# Patient Record
Sex: Female | Born: 1990 | Hispanic: Yes | Marital: Married | Smoking: Never smoker
Health system: Southern US, Community
[De-identification: ages and names within clinical notes are randomized; demographics above are authoritative.]

## PROBLEM LIST (undated history)

## (undated) DIAGNOSIS — F32A Depression, unspecified: Secondary | ICD-10-CM

## (undated) DIAGNOSIS — J45909 Unspecified asthma, uncomplicated: Secondary | ICD-10-CM

## (undated) DIAGNOSIS — F909 Attention-deficit hyperactivity disorder, unspecified type: Secondary | ICD-10-CM

## (undated) DIAGNOSIS — F329 Major depressive disorder, single episode, unspecified: Secondary | ICD-10-CM

## (undated) HISTORY — DX: Depression, unspecified: F32.A

## (undated) HISTORY — DX: Attention-deficit hyperactivity disorder, unspecified type: F90.9

## (undated) HISTORY — PX: KNEE SURGERY: SHX244

## (undated) HISTORY — DX: Unspecified asthma, uncomplicated: J45.909

## (undated) HISTORY — PX: ARTHROSCOPIC REPAIR ACL: SUR80

## (undated) HISTORY — DX: Major depressive disorder, single episode, unspecified: F32.9

---
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
## 2010-08-26 IMAGING — CR DG CERVICAL SPINE COMPLETE 4+V
5 series · 5 of 5 positions shown · non-contrast
Comparison: None

CLINICAL DATA: Fall.  Neck trauma and pain.

CERVICAL SPINE - 4+ VIEWS

[w c-spine lat]
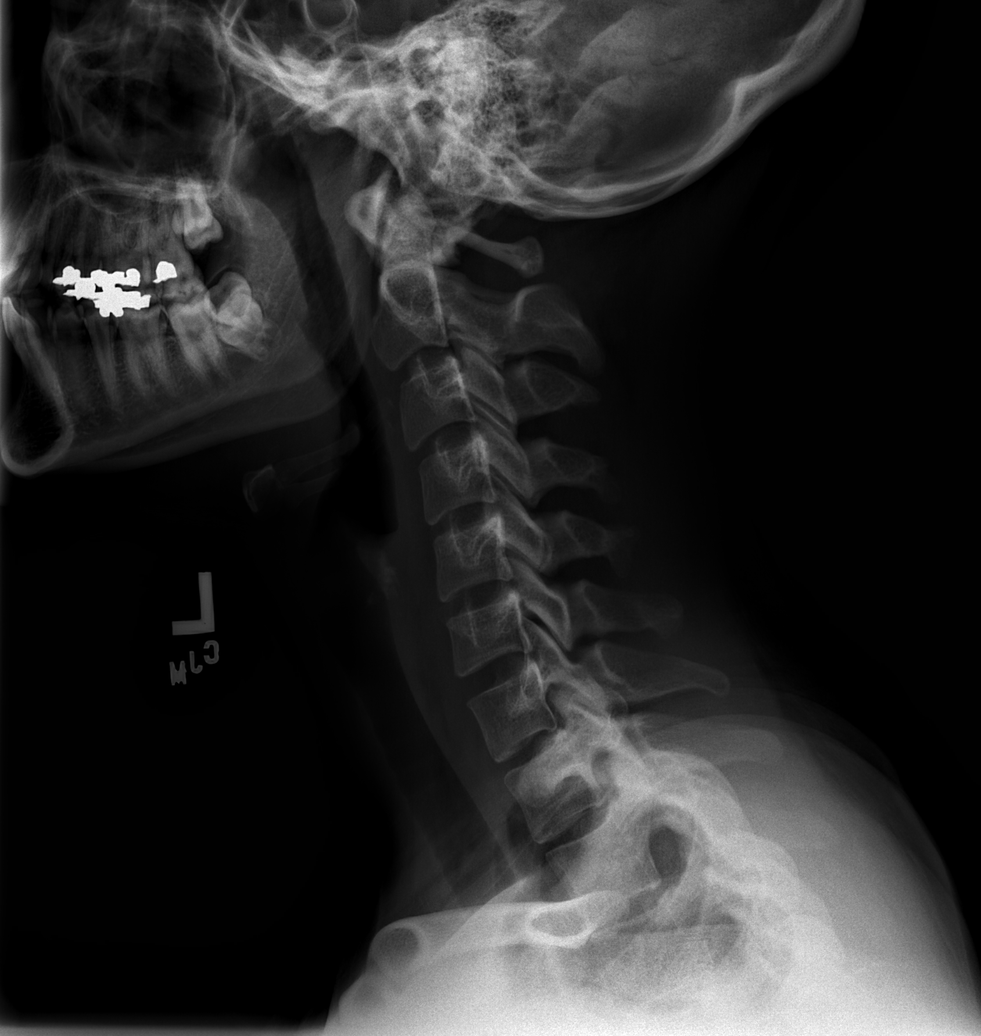

[w c-spine oblique (1 of 2)]
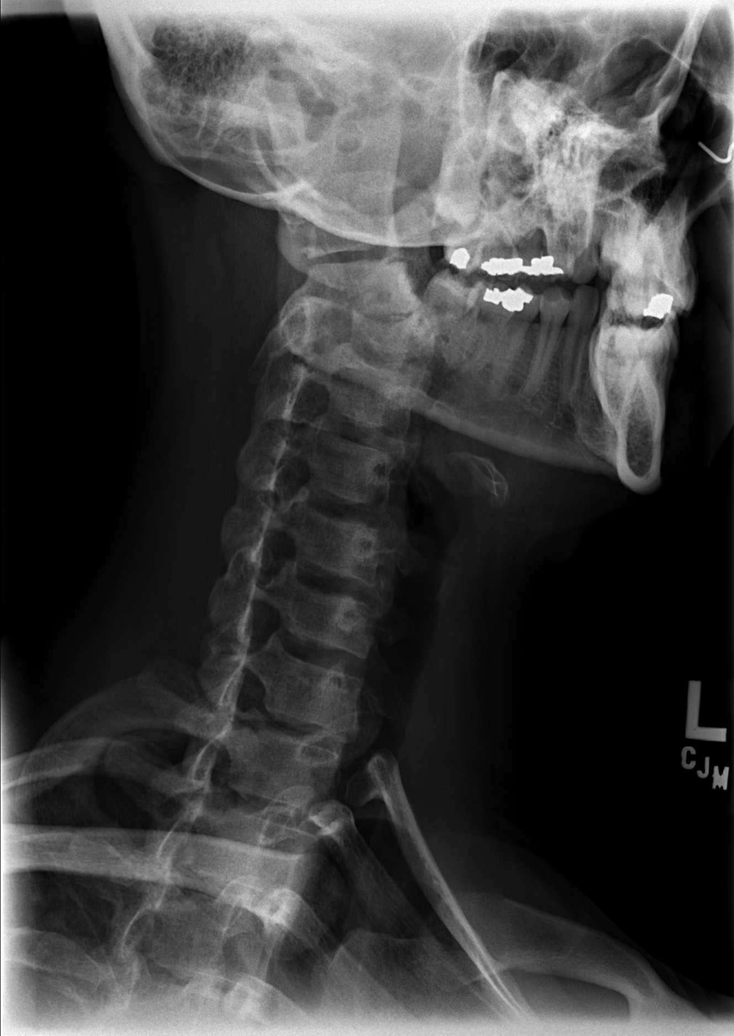

[w c-spine oblique (2 of 2)]
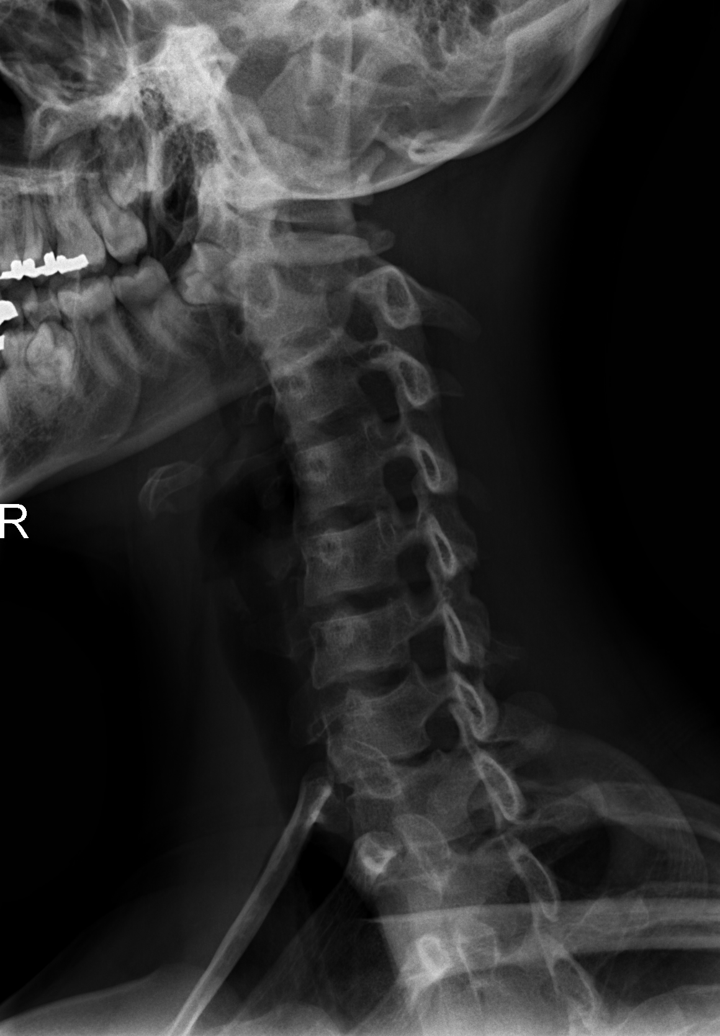

[w c-spine a.p.]
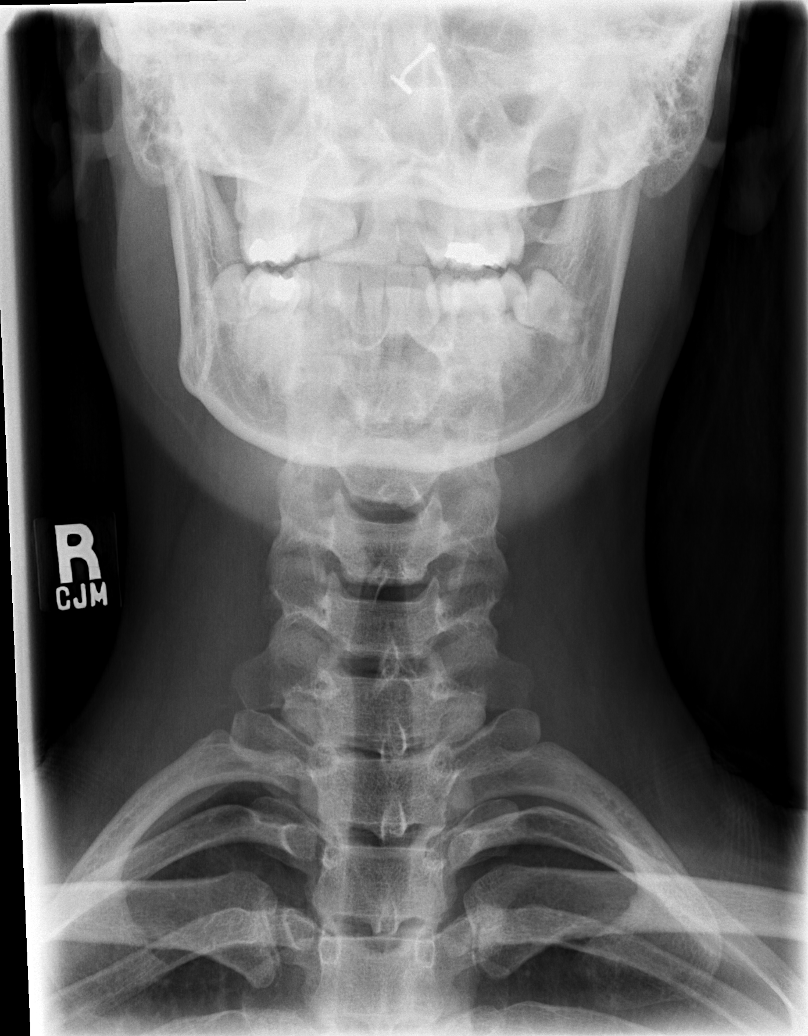

[w c-spine odontoid]
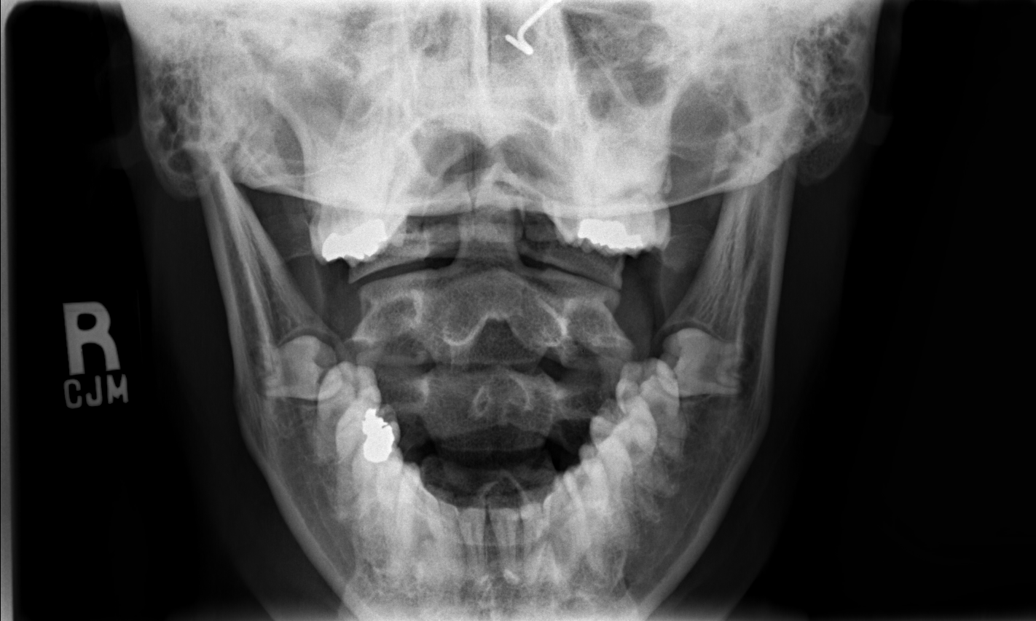

[5 of 5 positions shown; findings below may reference images not displayed]

FINDINGS: There is no evidence of cervical spine fracture or
prevertebral soft tissue swelling.  Alignment is normal.  No other
significant bone abnormalities are identified.
IMPRESSION: Negative cervical spine radiographs.

## 2011-11-21 ENCOUNTER — Ambulatory Visit (INDEPENDENT_AMBULATORY_CARE_PROVIDER_SITE_OTHER): Payer: BC Managed Care – PPO

## 2011-11-21 DIAGNOSIS — A088 Other specified intestinal infections: Secondary | ICD-10-CM

## 2012-01-14 ENCOUNTER — Telehealth: Payer: Self-pay

## 2012-01-14 DIAGNOSIS — Z309 Encounter for contraceptive management, unspecified: Secondary | ICD-10-CM

## 2012-01-14 DIAGNOSIS — F909 Attention-deficit hyperactivity disorder, unspecified type: Secondary | ICD-10-CM

## 2012-01-14 NOTE — Telephone Encounter (Signed)
Chart in box in dr lounge if needed

## 2012-01-14 NOTE — Telephone Encounter (Addendum)
.  [facility] PT IS LEAVING THE COUNTRY FOR SIX MONTHS  WANTS TO TALK WITH DR. Merla Riches REGARDING MEDS  DURING HER TIME ABROAD  CALL [phone]   [facility] called and patient doesn't understand why Dr. Merla Riches did a supply for adderall when she will be out of the country for please contact patient or pharmacy kelly the pharmacist can be reached on [phone]

## 2012-01-16 ENCOUNTER — Other Ambulatory Visit: Payer: Self-pay | Admitting: Internal Medicine

## 2012-01-16 DIAGNOSIS — F909 Attention-deficit hyperactivity disorder, unspecified type: Secondary | ICD-10-CM | POA: Insufficient documentation

## 2012-01-16 DIAGNOSIS — Z309 Encounter for contraceptive management, unspecified: Secondary | ICD-10-CM | POA: Insufficient documentation

## 2012-01-16 MED ORDER — AMPHETAMINE-DEXTROAMPHETAMINE 30 MG PO TABS: 30.0000 mg | ORAL_TABLET | Freq: Two times a day (BID) | ORAL | Status: DC

## 2012-01-18 NOTE — Telephone Encounter (Signed)
LMOM for pt to notify that her Rx is ready for p/up.

## 2012-01-18 NOTE — Telephone Encounter (Signed)
Dr Merla Riches, it looks like you have an unsigned Rx in system and it will not let me close the encounter.

## 2012-01-22 NOTE — Telephone Encounter (Signed)
PT WANTS TO SPEAK TO DR. Merla Riches RE: HER ADDERALL MEDICATION.

## 2012-01-24 NOTE — Telephone Encounter (Signed)
Get a message fron her... 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

## 2012-01-25 ENCOUNTER — Other Ambulatory Visit: Payer: Self-pay | Admitting: Internal Medicine

## 2012-01-25 DIAGNOSIS — F988 Other specified behavioral and emotional disorders with onset usually occurring in childhood and adolescence: Secondary | ICD-10-CM

## 2012-01-25 MED ORDER — AMPHETAMINE-DEXTROAMPHETAMINE 30 MG PO TABS: 30.0000 mg | ORAL_TABLET | Freq: Two times a day (BID) | ORAL | Status: DC

## 2012-01-25 NOTE — Telephone Encounter (Signed)
She will come in as an add on this wed at 4pm at 104 2/13 For a pap and exam before her trip to [place]

## 2012-01-25 NOTE — Telephone Encounter (Signed)
LMOM for patient to call back so we can get questions for Dr. Merla Riches.

## 2012-01-27 ENCOUNTER — Ambulatory Visit (INDEPENDENT_AMBULATORY_CARE_PROVIDER_SITE_OTHER): Payer: BC Managed Care – PPO | Admitting: Internal Medicine

## 2012-01-27 DIAGNOSIS — F329 Major depressive disorder, single episode, unspecified: Secondary | ICD-10-CM | POA: Insufficient documentation

## 2012-01-27 DIAGNOSIS — B9689 Other specified bacterial agents as the cause of diseases classified elsewhere: Secondary | ICD-10-CM

## 2012-01-27 DIAGNOSIS — F909 Attention-deficit hyperactivity disorder, unspecified type: Secondary | ICD-10-CM | POA: Insufficient documentation

## 2012-01-27 DIAGNOSIS — N760 Acute vaginitis: Secondary | ICD-10-CM

## 2012-01-27 DIAGNOSIS — F32A Depression, unspecified: Secondary | ICD-10-CM | POA: Insufficient documentation

## 2012-01-27 LAB — POCT WET PREP WITH KOH
Clue Cells Wet Prep HPF POC: 100
RBC Wet Prep HPF POC: NEGATIVE

## 2012-01-27 MED ORDER — FLUCONAZOLE 150 MG PO TABS: 150.0000 mg | ORAL_TABLET | Freq: Once | ORAL | Status: AC

## 2012-01-27 MED ORDER — METRONIDAZOLE 500 MG PO TABS: 500.0000 mg | ORAL_TABLET | Freq: Two times a day (BID) | ORAL | Status: AC

## 2012-01-27 NOTE — Telephone Encounter (Signed)
LMOM that Rx is ready for p/up. Dr Merla Riches, it looks like the Rx needs to be signed in Epic and then you can close encounter.

## 2012-01-27 NOTE — Progress Notes (Signed)
  Subjective:    Patient ID: Jasmine Coffey, female    DOB: 11/08/1991, 20 y.o.   MRN: [id number]  HPIcomes in with a two-month history of a vaginal wetness and other with slight itching. The last department was more than 3 months ago. There is no dysuria or pain. Menses are regular  She is preparing to leave for [place] for 6 months next week. ADD meds have been updated for the duration of the trip.    Review of Systems     Objective:   Physical Exam  The introitus is clear with a thin watery white discharge at the cervix. 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

## 2012-02-03 NOTE — Telephone Encounter (Signed)
Dr Merla Riches, it looks as if you reviewed this after I routed this to you last time, but it looks as if the medication still needs to be signed and encounter closed?

## 2012-02-03 NOTE — Telephone Encounter (Signed)
#   dispensed was changed at last OV so #125 cancelled

## 2012-06-20 ENCOUNTER — Other Ambulatory Visit: Payer: Self-pay | Admitting: Internal Medicine

## 2012-08-09 ENCOUNTER — Ambulatory Visit (INDEPENDENT_AMBULATORY_CARE_PROVIDER_SITE_OTHER): Payer: BC Managed Care – PPO | Admitting: Family Medicine

## 2012-08-09 VITALS — BP 108/60 | HR 78 | Temp 98.4°F | Resp 16 | Ht 68.0 in | Wt 170.0 lb

## 2012-08-09 DIAGNOSIS — R4589 Other symptoms and signs involving emotional state: Secondary | ICD-10-CM

## 2012-08-09 DIAGNOSIS — N6019 Diffuse cystic mastopathy of unspecified breast: Secondary | ICD-10-CM

## 2012-08-09 DIAGNOSIS — F99 Mental disorder, not otherwise specified: Secondary | ICD-10-CM

## 2012-08-09 DIAGNOSIS — B9689 Other specified bacterial agents as the cause of diseases classified elsewhere: Secondary | ICD-10-CM

## 2012-08-09 DIAGNOSIS — N898 Other specified noninflammatory disorders of vagina: Secondary | ICD-10-CM

## 2012-08-09 DIAGNOSIS — Z113 Encounter for screening for infections with a predominantly sexual mode of transmission: Secondary | ICD-10-CM

## 2012-08-09 DIAGNOSIS — D229 Melanocytic nevi, unspecified: Secondary | ICD-10-CM

## 2012-08-09 DIAGNOSIS — F909 Attention-deficit hyperactivity disorder, unspecified type: Secondary | ICD-10-CM

## 2012-08-09 DIAGNOSIS — D239 Other benign neoplasm of skin, unspecified: Secondary | ICD-10-CM

## 2012-08-09 LAB — POCT URINALYSIS DIPSTICK
Bilirubin, UA: NEGATIVE
Blood, UA: NEGATIVE
Glucose, UA: NEGATIVE
Ketones, UA: NEGATIVE
Leukocytes, UA: NEGATIVE
Nitrite, UA: NEGATIVE
Protein, UA: NEGATIVE
Spec Grav, UA: >=1.03
Urobilinogen, UA: 0.2
pH, UA: 5

## 2012-08-09 LAB — POCT UA - MICROSCOPIC ONLY
Casts, Ur, LPF, POC: NEGATIVE
Crystals, Ur, HPF, POC: NEGATIVE
RBC, urine, microscopic: NEGATIVE
Yeast, UA: NEGATIVE

## 2012-08-09 LAB — POCT WET PREP WITH KOH
KOH Prep POC: NEGATIVE
RBC Wet Prep HPF POC: NEGATIVE
Trichomonas, UA: NEGATIVE
Yeast Wet Prep HPF POC: NEGATIVE

## 2012-08-09 MED ORDER — AMPHETAMINE-DEXTROAMPHETAMINE 30 MG PO TABS: 30.0000 mg | ORAL_TABLET | Freq: Two times a day (BID) | ORAL | Status: DC

## 2012-08-09 MED ORDER — METRONIDAZOLE 500 MG PO TABS: 500.0000 mg | ORAL_TABLET | Freq: Two times a day (BID) | ORAL | Status: AC

## 2012-08-09 NOTE — Progress Notes (Signed)
[facility]:  Office Visit  Chief Complaint:  Chief Complaint  Patient presents with  . Vaginal Itching    vaginal odor and itch   . Rash    skin issue/ mole? needs to be checked  . ADHD    refills for on file    HPI: Jasmine Coffey is a 21 y.o. female who complains of  1. ADHD medication refills. Sees Dr Merla Riches.  2. Had BV , has odor, denies fevers, chills, pelvic pain 3. Mole on back, does not notice when she has had it. She noticed it 2 weeks ago. Maternal grandfather wih h/o melanoma and also precancerous lesions 4. Moodiness. Tired and more irritable.   Past Medical History  Diagnosis Date  . Depression   . ADHD (attention deficit hyperactivity disorder)    No past surgical history on file. History   Social History  . Marital Status: Married    Spouse Name: N/A    Number of Children: N/A  . Years of Education: N/A   Social History Main Topics  . Smoking status: Never Smoker   . Smokeless tobacco: None  . Alcohol Use: None  . Drug Use: None  . Sexually Active: None   Other Topics Concern  . None   Social History Narrative  . None   Family History  Problem Relation Age of Onset  . Diabetes Father   . Cancer Maternal Grandfather    Allergies  Allergen Reactions  . Codeine Nausea And Vomiting   Prior to Admission medications   Medication Sig Start Date End Date Taking? Authorizing Provider  albuterol (PROVENTIL) (2.5 MG/3ML) 0.083% nebulizer solution Take 2.5 mg by nebulization every 6 (six) hours as needed.   Yes Historical Provider, MD  amphetamine-dextroamphetamine (ADDERALL, 30MG ,) 30 MG tablet Take 1 tablet (30 mg total) by mouth 2 (two) times daily. 01/25/12 07/04/12  Tonye Pearson, MD     ROS: The patient denies fevers, chills, night sweats, unintentional weight loss, chest pain, palpitations, wheezing, dyspnea on exertion, nausea, vomiting, abdominal pain, dysuria, hematuria, melena, numbness, weakness, or  tingling.  All other systems have been reviewed and were otherwise negative with the exception of those mentioned in the HPI and as above.    PHYSICAL EXAM: Filed Vitals:   08/09/12 1533  BP: 108/60  Pulse: 78  Temp: 98.4 F (36.9 C)  Resp: 16   Filed Vitals:   08/09/12 1533  Height: 5\' 8"  (1.727 m)  Weight: 170 lb (77.111 kg)   Body mass index is 25.85 kg/(m^2).  General: Alert, no acute distress HEENT:  Normocephalic, atraumatic, oropharynx patent.  Cardiovascular:  Regular rate and rhythm, no rubs murmurs or gallops.  No Carotid bruits, radial pulse intact. No pedal edema.  Respiratory: Clear to auscultation bilaterally.  No wheezes, rales, or rhonchi.  No cyanosis, no use of accessory musculature GI: No organomegaly, abdomen is soft and non-tender, positive bowel sounds.  No masses. Skin: No rashes. Blue nevus 1 mm , benign appearing Neurologic: Facial musculature symmetric. Psychiatric: Patient is appropriate throughout our interaction. Lymphatic: No cervical lymphadenopathy Musculoskeletal: Gait intact.   LABS: Results for orders placed in visit on 08/09/12  POCT WET PREP WITH KOH      Component Value Range   Trichomonas, UA Negative     Clue Cells Wet Prep HPF POC 3-12     Epithelial Wet Prep HPF POC 3-13     Yeast Wet Prep HPF POC neg     Bacteria  Wet Prep HPF POC 3+ cocci     RBC Wet Prep HPF POC neg     WBC Wet Prep HPF POC 8-15     KOH Prep POC Negative    POCT URINALYSIS DIPSTICK      Component Value Range   Color, UA yellow     Clarity, UA hazy     Glucose, UA neg     Bilirubin, UA neg     Ketones, UA neg     Spec Grav, UA >=1.030     Blood, UA neg     pH, UA 5.0     Protein, UA neg     Urobilinogen, UA 0.2     Nitrite, UA neg     Leukocytes, UA Negative    POCT UA - MICROSCOPIC ONLY      Component Value Range   WBC, Ur, HPF, POC 0-1     RBC, urine, microscopic neg     Bacteria, U Microscopic 3+     Mucus, UA 3+     Epithelial cells, urine  per micros 3-7     Crystals, Ur, HPF, POC neg     Casts, Ur, LPF, POC neg     Yeast, UA neg       EKG/XRAY:   Primary read interpreted by Dr. Conley Rolls at [facility].   ASSESSMENT/PLAN: Encounter Diagnoses  Name Primary?  . ADHD (attention deficit hyperactivity disorder) Yes  . Vaginal Discharge   . Mole of skin   . Moodiness   . Screening for STD (sexually transmitted disease)    1. Refilled meds for ADHD x 3 months 8/27, 9/27, 10/27 2. BV rx Flagyl 500 mg BID 3. Patient will decide if she wants Celexa later on or not. Advise on therapy ie CBT and meds or no meds and just therapy or just meds. She will decide what she wants to do. 3. Labs pending for STD screen Recommend f/u for annual with pap.     Hamilton Capri PHUONG, DO 08/09/2012 4:36 PM

## 2012-08-10 LAB — GC/CHLAMYDIA PROBE AMP, GENITAL
Chlamydia, DNA Probe: NEGATIVE
GC Probe Amp, Genital: NEGATIVE

## 2012-08-10 LAB — HSV(HERPES SIMPLEX VRS) I + II AB-IGG
HSV 1 Glycoprotein G Ab, IgG: 0.11 IV
HSV 2 Glycoprotein G Ab, IgG: 0.22 IV

## 2012-08-10 LAB — HIV ANTIBODY (ROUTINE TESTING W REFLEX): HIV: NONREACTIVE

## 2012-08-10 LAB — RPR

## 2012-08-16 ENCOUNTER — Encounter: Payer: Self-pay | Admitting: Family Medicine

## 2012-09-14 ENCOUNTER — Telehealth: Payer: Self-pay

## 2012-09-14 NOTE — Telephone Encounter (Signed)
It appears she saw Dr. Conley Rolls in August and had BV.  Please get details.

## 2012-09-14 NOTE — Telephone Encounter (Signed)
PT REQUESTING CALL BACK FROM DR Merla Riches RE HER BACTERIAL INFECTION.   BEST PHONE [phone]   PHARMACY [facility]

## 2012-09-15 NOTE — Telephone Encounter (Signed)
I called patient back, advised that we need to recheck so we can see if she has recurrent BV or if she has a yeast infection.

## 2012-09-15 NOTE — Telephone Encounter (Signed)
She is going to come back in for recheck. She did want me to forward message to Dr Merla Riches so he would be aware. Amy

## 2012-09-16 NOTE — Telephone Encounter (Signed)
Thanks, I called her back to advise. I left message for her.

## 2012-09-16 NOTE — Telephone Encounter (Signed)
I work 10-6 Saturday and would be glad to see her.

## 2012-10-19 NOTE — Progress Notes (Signed)
Completed prior auth over the phone for pt's Adderall 30 mg and received approval through 10/19/13. Faxed approval notice to pharmacy

## 2013-01-19 ENCOUNTER — Telehealth: Payer: Self-pay

## 2013-01-19 NOTE — Telephone Encounter (Signed)
Please call patient and let her know she will have to come in. There is no way we can just write for the medication.

## 2013-01-19 NOTE — Telephone Encounter (Signed)
Pt would like for dr. Cleta Alberts to "personally call her" for a refill on adderall. She does not have $40 to come in and be seen.   [phone]

## 2013-01-19 NOTE — Telephone Encounter (Signed)
Patient is due for follow up in regards to ADHD / she states she can not follow up and does not know when she will be able to come in. She wants renewal on Adderall, please advise.

## 2013-01-20 NOTE — Telephone Encounter (Signed)
I called her and reviewed the cardiovascular risks of the adderall, she will come in.

## 2013-02-01 ENCOUNTER — Ambulatory Visit: Payer: BC Managed Care – PPO | Admitting: Internal Medicine

## 2013-03-04 ENCOUNTER — Ambulatory Visit: Payer: Self-pay | Admitting: Family Medicine

## 2013-03-04 VITALS — BP 118/71 | HR 82 | Temp 97.9°F | Resp 16 | Ht 68.25 in | Wt 169.2 lb

## 2013-03-04 DIAGNOSIS — F909 Attention-deficit hyperactivity disorder, unspecified type: Secondary | ICD-10-CM

## 2013-03-04 MED ORDER — AMPHETAMINE-DEXTROAMPHETAMINE 30 MG PO TABS: ORAL_TABLET | ORAL | Status: DC

## 2013-03-04 NOTE — Patient Instructions (Addendum)
[facility] Policy for Prescribing Controlled Substances (Revised 10/2012) 1. Prescriptions for controlled substances will be filled by ONE provider at [facility] with whom you have established and developed a plan for your care, including follow-up. 2. You are encouraged to schedule an appointment with your prescriber at our appointment center for follow-up visits whenever possible. 3. If you request a prescription for the controlled substance while at [facility] for an acute problem (with someone other than your regular prescriber), you MAY be given a ONE-TIME prescription for a 30-day supply of the controlled substance, to allow time for you to return to see your regular prescriber for additional prescriptions.   Take medication as directed. Be cautious with the 90 day supply this summer. We cannot refill lost prescriptions.

## 2013-03-04 NOTE — Progress Notes (Signed)
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 She will be gone from May 12 for approximately 3 months. She is on ADHD medication, but do to and insurance glitch has been out for several weeks. She is here for refill of her medications. She would like to be able to be treated in a fashion that she does not need to come back in here in the next 6 weeks before going to [place].  She is generally been treated by Dr. Merla Riches. She's been on ADHD medication for about 6 years.  Objective: Healthy-appearing young lady. Physical examination not done today.  Assessment: ADHD  Plan: Refill her medications with 30 days until late April, 30 days and which will carry her until late May. Gave her a 90 day prescription that she can get filled before her trip which will cover her until she comes back in August. She was cautioned that lost prescriptions will not be refilled. She was advised of the current controlled substance policy and directed to see Dr. Merla Riches in August.

## 2013-04-24 ENCOUNTER — Ambulatory Visit: Payer: Self-pay | Admitting: Internal Medicine

## 2013-04-24 VITALS — BP 124/87 | HR 75 | Temp 98.4°F | Resp 16 | Ht 68.0 in | Wt 166.0 lb

## 2013-04-24 DIAGNOSIS — Z01419 Encounter for gynecological examination (general) (routine) without abnormal findings: Secondary | ICD-10-CM

## 2013-04-24 DIAGNOSIS — N898 Other specified noninflammatory disorders of vagina: Secondary | ICD-10-CM

## 2013-04-24 DIAGNOSIS — L293 Anogenital pruritus, unspecified: Secondary | ICD-10-CM

## 2013-04-24 LAB — POCT WET PREP WITH KOH
Clue Cells Wet Prep HPF POC: NEGATIVE
Trichomonas, UA: NEGATIVE

## 2013-04-24 MED ORDER — METRONIDAZOLE 500 MG PO TABS: 500.0000 mg | ORAL_TABLET | Freq: Two times a day (BID) | ORAL | Status: DC

## 2013-04-24 MED ORDER — FLUCONAZOLE 150 MG PO TABS: 150.0000 mg | ORAL_TABLET | Freq: Once | ORAL | Status: DC

## 2013-04-24 NOTE — Progress Notes (Signed)
  Subjective:    Patient ID: Jasmine Coffey, female    DOB: 08-19-1991, 22 y.o.   MRN: [id number]  HPI Continues to have problems with recurrent bacterial vaginosis causing redness with slight irritation and odor-no history in chart/last treatment elsewhere and was not given Diflucan in the aftermath so developed pruritus. Just had a period and her symptoms have recurred. Never pap. Sexually active/4 partners in the last 6 months/uses condoms/no dyspareunia  Only one semester of college remaining/C. 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   Review of Systems No weight loss No fever chills or night sweats Remains very active No psychological issues    Objective:   Physical Exam BP 124/87  Pulse 75  Temp(Src) 98.4 F (36.9 C) (Oral)  Resp 16  Ht 5\' 8"  (1.727 m)  Wt 166 lb (75.297 kg)  BMI 25.25 kg/m2  SpO2 100%  LMP 04/20/2013 HEENT clear Heart regular without murmur Lungs clear Abdomen benign Introitus clear Os not friable but there is slight vaginal bleeding Uterus mid position nontender No adnexal masses or tenderness  Results for orders placed in visit on 04/24/13  POCT WET PREP WITH KOH      Result Value Range   Trichomonas, UA Negative     Clue Cells Wet Prep HPF POC neg     Epithelial Wet Prep HPF POC 0-2     Yeast Wet Prep HPF POC neg     Bacteria Wet Prep HPF POC neg     RBC Wet Prep HPF POC 0-3     WBC Wet Prep HPF POC 0-1     KOH Prep POC Negative          Assessment & Plan:  Recurrent BV Yeast vaginitis due to antibiotics ?spermacide hypersens STD screen ADD AR  Will treat with Flagyl 500 twice a day for 14 days and given prescription for self treatment while in [place] if needed Start align or refresh Trial of noncoated condoms She has ADD meds for her trip Cover for yeast  Meds ordered this encounter  Medications  . metroNIDAZOLE  (FLAGYL) 500 MG tablet    Sig: Take 1 tablet (500 mg total) by mouth 2 (two) times daily.    Dispense:  14 tablet    Refill:  0  . metroNIDAZOLE (FLAGYL) 500 MG tablet    Sig: Take 1 tablet (500 mg total) by mouth 2 (two) times daily. As directed    Dispense:  42 tablet    Refill:  0  . fluconazole (DIFLUCAN) 150 MG tablet    Sig: Take 1 tablet (150 mg total) by mouth once. Once a week for 4 weeks    Dispense:  4 tablet    Refill:  0

## 2013-04-26 LAB — PAP IG W/ RFLX HPV ASCU

## 2013-04-26 LAB — GC/CHLAMYDIA PROBE AMP: GC Probe RNA: NEGATIVE

## 2013-08-09 ENCOUNTER — Ambulatory Visit: Payer: BC Managed Care – PPO | Admitting: Family Medicine

## 2013-08-09 VITALS — BP 100/70 | HR 80 | Temp 98.0°F | Resp 16 | Ht 69.0 in | Wt 168.0 lb

## 2013-08-09 DIAGNOSIS — F909 Attention-deficit hyperactivity disorder, unspecified type: Secondary | ICD-10-CM

## 2013-08-09 MED ORDER — AMPHETAMINE-DEXTROAMPHETAMINE 30 MG PO TABS: 30.0000 mg | ORAL_TABLET | Freq: Two times a day (BID) | ORAL | Status: DC

## 2013-08-09 NOTE — Progress Notes (Signed)
[facility]:  Office Visit  Chief Complaint:  Chief Complaint  Patient presents with  . Medication Refill    adderall    HPI: Jasmine Coffey is a 22 y.o. female who complains of  Here for aderrall  Refill. She is at [employer]. 1 year left.  She recently went to [place].  She is doing well. She has drug holidays on the weekend but really depends if she has things she needs to get done. She has no SEs. No SI/HI  Past Medical History  Diagnosis Date  . Depression   . ADHD (attention deficit hyperactivity disorder)   . Asthma    Past Surgical History  Procedure Laterality Date  . Knee surgery    . Arthroscopic repair acl     History   Social History  . Marital Status: Married    Spouse Name: N/A    Number of Children: N/A  . Years of Education: N/A   Social History Main Topics  . Smoking status: Never Smoker   . Smokeless tobacco: None  . Alcohol Use: 17.6 oz/week    8 Cans of beer, 8 Shots of liquor, 16 Drinks containing 0.5 oz of alcohol per week  . Drug Use: No  . Sexual Activity: Yes    Birth Control/ Protection: None   Other Topics Concern  . None   Social History Narrative  . None   Family History  Problem Relation Age of Onset  . Diabetes Father   . Cancer Maternal Grandfather    Allergies  Allergen Reactions  . Codeine Nausea And Vomiting   Prior to Admission medications   Medication Sig Start Date End Date Taking? Authorizing Provider  amphetamine-dextroamphetamine (ADDERALL) 30 MG tablet One twice daily for ADHD.  Fill on or after 04/20/13 before trip to [place].  Will cover 05/04/13-08/04/13 medication need. 03/04/13  Yes Peyton Najjar, MD  albuterol (PROVENTIL) (2.5 MG/3ML) 0.083% nebulizer solution Take 2.5 mg by nebulization every 6 (six) hours as needed.    Historical Provider, MD  fluconazole (DIFLUCAN) 150 MG tablet Take 1 tablet (150 mg total) by mouth once. Once a week for 4 weeks 04/24/13   Tonye Pearson,  MD  metroNIDAZOLE (FLAGYL) 500 MG tablet Take 1 tablet (500 mg total) by mouth 2 (two) times daily. 04/24/13   Tonye Pearson, MD  metroNIDAZOLE (FLAGYL) 500 MG tablet Take 1 tablet (500 mg total) by mouth 2 (two) times daily. As directed 04/24/13   Tonye Pearson, MD     ROS: The patient denies fevers, chills, night sweats, unintentional weight loss, chest pain, palpitations, wheezing, dyspnea on exertion, nausea, vomiting, abdominal pain, dysuria, hematuria, melena, numbness, weakness, or tingling.   All other systems have been reviewed and were otherwise negative with the exception of those mentioned in the HPI and as above.    PHYSICAL EXAM: Filed Vitals:   08/09/13 1602  BP: 100/70  Pulse: 80  Temp: 98 F (36.7 C)  Resp: 16   Filed Vitals:   08/09/13 1602  Height: 5\' 9"  (1.753 m)  Weight: 168 lb (76.204 kg)   Body mass index is 24.8 kg/(m^2).  General: Alert, no acute distress HEENT:  Normocephalic, atraumatic, oropharynx patent. EOMI, PERRLA Cardiovascular:  Regular rate and rhythm, no rubs murmurs or gallops.  No Carotid bruits, radial pulse intact. No pedal edema.  Respiratory: Clear to auscultation bilaterally.  No wheezes, rales, or rhonchi.  No cyanosis, no use of accessory musculature  GI: No organomegaly, abdomen is soft and non-tender, positive bowel sounds.  No masses. Skin: No rashes. Neurologic: Facial musculature symmetric. Psychiatric: Patient is appropriate throughout our interaction. Lymphatic: No cervical lymphadenopathy Musculoskeletal: Gait intact.   LABS:    EKG/XRAY:   Primary read interpreted by Dr. Conley Rolls at [facility].   ASSESSMENT/PLAN: Encounter Diagnosis  Name Primary?  . ADHD (attention deficit hyperactivity disorder) Yes   Refill meds for 6 months, rx given . [facility] Denies SI/HI/hallucintations/drug abuse She doing well F/u in 6 months Gross sideeffects, risk and benefits, and alternatives of medications  d/w patient. Patient is aware that all medications have potential sideeffects and we are unable to predict every sideeffect or drug-drug interaction that may occur.  Hamilton Capri PHUONG, DO 08/09/2013 4:42 PM

## 2014-02-09 ENCOUNTER — Ambulatory Visit (INDEPENDENT_AMBULATORY_CARE_PROVIDER_SITE_OTHER): Payer: BC Managed Care – PPO | Admitting: Internal Medicine

## 2014-02-09 VITALS — BP 128/80 | HR 63 | Temp 98.0°F | Resp 16 | Ht 68.5 in | Wt 173.0 lb

## 2014-02-09 DIAGNOSIS — F909 Attention-deficit hyperactivity disorder, unspecified type: Secondary | ICD-10-CM

## 2014-02-09 DIAGNOSIS — Z7189 Other specified counseling: Secondary | ICD-10-CM

## 2014-02-09 NOTE — Patient Instructions (Addendum)
[facility] Policy for Prescribing Controlled Substances (Revised 10/2012) 1. Prescriptions for controlled substances will be filled by ONE provider at [facility] with whom you have established and developed a plan for your care, including follow-up. 2. You are encouraged to schedule an appointment with your prescriber at our appointment center for follow-up visits whenever possible. 3. If you request a prescription for the controlled substance while at [facility] for an acute problem (with someone other than your regular prescriber), you MAY be given a ONE-TIME prescription for a 30-day supply of the controlled substance, to allow time for you to return to see your regular prescriber for additional prescriptions. 4.   Mood Disorders Mood disorders are conditions that affect the way a person feels emotionally. The main mood disorders include:  Depression.  Bipolar disorder.  Dysthymia. Dysthymia is a mild, lasting (chronic) depression. Symptoms of dysthymia are similar to depression, but not as severe.  Cyclothymia. Cyclothymia includes mood swings, but the highs and lows are not as severe as they are in bipolar disorder. Symptoms of cyclothymia are similar to those of bipolar disorder, but less extreme. CAUSES  Mood disorders are probably caused by a combination of factors. People with mood disorders seem to have physical and chemical changes in their brains. Mood disorders run in families, so there may be genetic causes. Severe trauma or stressful life events may also increase the risk of mood disorders.  SYMPTOMS  Symptoms of mood disorders depend on the specific type of condition. Depression symptoms include:  Feeling sad, worthless, or hopeless.  Negative thoughts.  Inability to enjoy one's usual activities.  Low energy.  Sleeping too much or too little.  Appetite changes.  Crying.  Concentration problems.  Thoughts of harming oneself. Bipolar disorder symptoms include:  Periods of  depression (see above symptoms).  Mood swings, from sadness and depression, to abnormal elation and excitement.  Periods of mania:  Racing thoughts.  Fast speech.  Poor judgment, and careless, dangerous choices.  Decreased need for sleep.  Risky behavior.  Difficulty concentrating.  Irritability.  Increased energy.  Increased sex drive. DIAGNOSIS  There are no blood tests or X-rays that can confirm a mood disorder. However, your caregiver may choose to run some tests to make sure that there is not another physical cause for your symptoms. A mood disorder is usually diagnosed after an in-depth interview with a caregiver. TREATMENT  Mood disorders can be treated with one or more of the following:  Medicine. This may include antidepressants, mood-stabilizers, or anti-psychotics.  Psychotherapy (talk therapy).  Cognitive behavioral therapy. You are taught to recognize negative thoughts and behavior patterns, and replace them with healthy thoughts and behaviors.  Electroconvulsive therapy. For very severe cases of deep depression, a series of treatments in which an electrical current is applied to the brain.  Vagus nerve stimulation. A pulse of electricity is applied to a portion of the brain.  Transcranial magnetic stimulation. Powerful magnets are placed on the head that produce electrical currents.  Hospitalization. In severe situations, or when someone is having serious thoughts of harming him or herself, hospitalization may be necessary in order to keep the person safe. This is also done to quickly start and monitor treatment. HOME CARE INSTRUCTIONS   Take your medicine exactly as directed.  Attend all of your therapy sessions.  Try to eat regular, healthy meals.  Exercise daily. Exercise may improve mood symptoms.  Get good sleep.  Do not drink alcohol or use pot or other drugs. These can  worsen mood symptoms and cause anxiety and psychosis.  Tell your  caregiver if you develop any side effects, such as feeling sick to your stomach (nauseous), dry mouth, dizziness, constipation, drowsiness, tremor, weight gain, or sexual symptoms. He or she may suggest things you can do to improve symptoms.  Learn ways to cope with the stress of having a chronic illness. This includes yoga, meditation, tai chi, or participating in a support group.  Drink enough water to keep your urine clear or pale yellow. Eat a high-fiber diet. These habits may help you avoid constipation from your medicine. SEEK IMMEDIATE MEDICAL CARE IF:  Your mood worsens.  You have thoughts of hurting yourself or others.  You cannot care for yourself.  You develop the sensation of hearing or seeing something that is not actually present (auditory or visual hallucinations).  You develop abnormal thoughts. Document Released: 09/27/2009 Document Revised: 02/22/2012 Document Reviewed: 09/27/2009 [facility] Patient Information 2014 [place], [place].

## 2014-02-09 NOTE — Progress Notes (Signed)
   Subjective:    Patient ID: Jasmine Coffey, female    DOB: 1991/08/20, 23 y.o.   MRN: [id number]  HPI    Review of Systems     Objective:   Physical Exam        Assessment & Plan:

## 2014-02-09 NOTE — Progress Notes (Signed)
   Subjective:    Patient ID: Jasmine Coffey, female    DOB: May 16, 1991, 23 y.o.   MRN: [id number]  HPI pt here for a medication refill on adderall. Pt has been taking adderall for nine years with no major side effects.  And also would like a mood stabilizer medication or depression medication. Her regular doctor is sr. Doolittle. Policy on narcotics reviewed.  She prefers to leave and see Dr. Keturah Barre. In next few days.    Review of Systems     Objective:   Physical Exam Appears healthy and well       Assessment & Plan:  No charge visit/Narcotic policy reviewed. Agrees to return later and see Dr. Maurice Small

## 2014-02-12 ENCOUNTER — Ambulatory Visit: Payer: BC Managed Care – PPO | Admitting: Internal Medicine

## 2014-02-12 VITALS — BP 114/72 | HR 72 | Temp 98.2°F | Resp 18 | Ht 67.0 in | Wt 173.2 lb

## 2014-02-12 DIAGNOSIS — F329 Major depressive disorder, single episode, unspecified: Secondary | ICD-10-CM

## 2014-02-12 DIAGNOSIS — G4700 Insomnia, unspecified: Secondary | ICD-10-CM

## 2014-02-12 DIAGNOSIS — F909 Attention-deficit hyperactivity disorder, unspecified type: Secondary | ICD-10-CM

## 2014-02-12 DIAGNOSIS — F988 Other specified behavioral and emotional disorders with onset usually occurring in childhood and adolescence: Secondary | ICD-10-CM

## 2014-02-12 DIAGNOSIS — R5381 Other malaise: Secondary | ICD-10-CM

## 2014-02-12 DIAGNOSIS — R5383 Other fatigue: Secondary | ICD-10-CM

## 2014-02-12 DIAGNOSIS — F32A Depression, unspecified: Secondary | ICD-10-CM

## 2014-02-12 MED ORDER — AMPHETAMINE-DEXTROAMPHETAMINE 30 MG PO TABS: 30.0000 mg | ORAL_TABLET | Freq: Two times a day (BID) | ORAL | Status: DC

## 2014-02-12 MED ORDER — CLONAZEPAM 0.5 MG PO TABS: 0.5000 mg | ORAL_TABLET | Freq: Every day | ORAL | Status: DC

## 2014-02-12 MED ORDER — SERTRALINE HCL 25 MG PO TABS: ORAL_TABLET | ORAL | Status: DC

## 2014-02-12 NOTE — Progress Notes (Signed)
This chart was scribed for Tami Lin, MD by Einar Pheasant, ED Scribe. This patient was seen in room 9 and the patient's care was started at 8:15 PM. Subjective:    Patient ID: Jasmine Coffey, female    DOB: 11-08-1991, 23 y.o.   MRN: [id number]  Chief Complaint  Patient presents with  . Medication Refill    adderall    HPI HPI Comments: Jasmine Coffey is a 23 y.o. female who presents to [facility] requesting a medication refill on her Adderall. Pt was prescribed 30 mg twice a day and she states that the dosage is working well for her. To graduate may 15-hopes to work in [place].  Pt states that she recently feels melancholy all the time and gets irritated easily. She states that when she gets into a routine she gets bored with life. She is also complaining of sleep disturbances. She goes to sleep really late, like 4 AM. Racing mind prevents sleep. Melatonin did not work, it caused her to have nightmares.   Otherwise, pt is generally heathy. She denies any other pertinent medical history. Exercise and diet good.   Patient Active Problem List   Diagnosis Date Noted  . Depression   . ADHD (attention deficit hyperactivity disorder)   . ADD (attention deficit disorder with hyperactivity) 01/16/2012  . Contraception management 01/16/2012   Past Medical History  Diagnosis Date  . Depression   . ADHD (attention deficit hyperactivity disorder)   . Asthma    Past Surgical History  Procedure Laterality Date  . Knee surgery    . Arthroscopic repair acl     Allergies  Allergen Reactions  . Codeine Nausea And Vomiting   Prior to Admission medications   Medication Sig Start Date End Date Taking? Authorizing Provider  albuterol (PROVENTIL) (2.5 MG/3ML) 0.083% nebulizer solution Take 2.5 mg by nebulization every 6 (six) hours as needed.   Yes Historical Provider, MD  amphetamine-dextroamphetamine (ADDERALL) 30 MG tablet Take 1 tablet (30 mg total) by mouth 2 (two) times daily. May  refill on 01/09/2014 08/09/13  Yes Thao P Le, DO      Review of Systems  Constitutional: Negative for fever, activity change, appetite change and unexpected weight change.  HENT: Negative for trouble swallowing.   Eyes: Negative for visual disturbance.  Respiratory: Negative for shortness of breath.   Cardiovascular: Negative for chest pain and palpitations.  Genitourinary: Negative for difficulty urinating and menstrual problem.  Neurological: Negative for headaches.  Psychiatric/Behavioral: Positive for sleep disturbance, dysphoric mood and agitation. Negative for behavioral problems and self-injury.       Objective:   Physical Exam  Nursing note and vitals reviewed. Constitutional: She is oriented to person, place, and time. She appears well-developed and well-nourished. No distress.  HENT:  Head: Normocephalic and atraumatic.  Eyes: EOM are normal. Pupils are equal, round, and reactive to light.  Neck: Normal range of motion. Neck supple. No thyromegaly present.  Cardiovascular: Normal rate and regular rhythm.   Pulmonary/Chest: Effort normal.  Lymphadenopathy:    She has no cervical adenopathy.  Neurological: She is alert and oriented to person, place, and time. No cranial nerve deficit.  Skin: Skin is warm and dry.  Psychiatric: She has a normal mood and affect. Her behavior is normal. Judgment and thought content normal.    Filed Vitals:   02/12/14 1911  BP: 114/72  Pulse: 72  Temp: 98.2 F (36.8 C)  TempSrc: Oral  Resp: 18  Height: 5\' 7"  (1.702 m)  Weight: 173 lb 3.2 oz (78.563 kg)  SpO2: 99%         Assessment & Plan:  ADHD (attention deficit hyperactivity disorder) - Plan: amphetamine-dextroamphetamine (ADDERALL) 30 MG tablet  Insomnia, unspecified  Depression/Fatigue  Meds ordered this encounter  Medications  . amphetamine-dextroamphetamine (ADDERALL) 30 MG tablet    Sig: Take 1 tablet (30 mg total) by mouth 2 (two) times daily. May refill on  01/09/2014    Dispense:  60 tablet    Refill:  0  . amphetamine-dextroamphetamine (ADDERALL) 30 MG tablet    Sig: Take 1 tablet (30 mg total) by mouth 2 (two) times daily. 30 days after signed    Dispense:  60 tablet    Refill:  0  . amphetamine-dextroamphetamine (ADDERALL) 30 MG tablet    Sig: Take 1 tablet (30 mg total) by mouth 2 (two) times daily. 60 days after signed    Dispense:  60 tablet    Refill:  0  . sertraline (ZOLOFT) 25 MG tablet    Sig: 1 daily for 2 weeks then increase to 2 daily    Dispense:  60 tablet    Refill:  0  . clonazePAM (KLONOPIN) 0.5 MG tablet    Sig: Take 1 tablet (0.5 mg total) by mouth at bedtime.    Dispense:  30 tablet    Refill:  1   F/u 4 weeks--   I have completed the patient encounter in its entirety as documented by the scribe, with editing by me where necessary. Robert P. Laney Pastor, M.D.

## 2014-02-15 NOTE — Progress Notes (Signed)
Appointment has been made for patient per Dr Doolittle's request.

## 2014-03-14 ENCOUNTER — Encounter: Payer: Self-pay | Admitting: Internal Medicine

## 2014-03-14 ENCOUNTER — Ambulatory Visit (INDEPENDENT_AMBULATORY_CARE_PROVIDER_SITE_OTHER): Payer: BC Managed Care – PPO | Admitting: Internal Medicine

## 2014-03-14 VITALS — BP 112/75 | HR 73 | Temp 98.6°F | Resp 16 | Ht 68.0 in | Wt 168.4 lb

## 2014-03-14 DIAGNOSIS — F32A Depression, unspecified: Secondary | ICD-10-CM

## 2014-03-14 DIAGNOSIS — F329 Major depressive disorder, single episode, unspecified: Secondary | ICD-10-CM

## 2014-03-14 DIAGNOSIS — Z309 Encounter for contraceptive management, unspecified: Secondary | ICD-10-CM

## 2014-03-14 DIAGNOSIS — N760 Acute vaginitis: Secondary | ICD-10-CM

## 2014-03-14 DIAGNOSIS — F3289 Other specified depressive episodes: Secondary | ICD-10-CM

## 2014-03-14 DIAGNOSIS — B9689 Other specified bacterial agents as the cause of diseases classified elsewhere: Secondary | ICD-10-CM

## 2014-03-14 DIAGNOSIS — Z209 Contact with and (suspected) exposure to unspecified communicable disease: Secondary | ICD-10-CM

## 2014-03-14 DIAGNOSIS — F909 Attention-deficit hyperactivity disorder, unspecified type: Secondary | ICD-10-CM

## 2014-03-14 MED ORDER — NORGESTIMATE-ETH ESTRADIOL 0.25-35 MG-MCG PO TABS: ORAL_TABLET | ORAL | Status: DC

## 2014-03-14 MED ORDER — SERTRALINE HCL 100 MG PO TABS: ORAL_TABLET | ORAL | Status: DC

## 2014-03-15 LAB — GC/CHLAMYDIA PROBE AMP
CT Probe RNA: NEGATIVE
GC PROBE AMP APTIMA: NEGATIVE

## 2014-03-15 NOTE — Progress Notes (Signed)
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 Responds to flagyl but relapses//not currently SA but >1 part last year///no current symptoms  Contraception management--wants to rest pills to control PMDD  ROS otherwise neg  Exam BP 112/75  Pulse 73  Temp(Src) 98.6 F (37 C) (Oral)  Resp 16  Ht 5\' 8"  (1.727 m)  Wt 168 lb 6.4 oz (76.386 kg)  BMI 25.61 kg/m2  SpO2 96%  LMP 03/07/2014 Mood good affect appropriate No other physical parameters needed  Exposure to communicable disease - Plan: GC/Chlamydia Probe Amp  Depression--Z 100  ADHD (attention deficit hyperactivity disorder)--same   BV (bacterial vaginosis)-Probiotics  Contraception management/PMDD--ocps 6 mos straight  Meds ordered this encounter  Medications  . sertraline (ZOLOFT) 100 MG tablet    daily    Dispense:  90 tablet    Refill:  3  . norgestimate-ethinyl estradiol (ORTHO-CYCLEN,SPRINTEC,PREVIFEM) 0.25-35 MG-MCG tablet    Sig: Take an active pill daily for 6 months and then take 7 days off before restarting    Dispense:  3 Package    Refill:  3   May refill Meds by phone or mychart 6 months

## 2014-04-10 ENCOUNTER — Telehealth: Payer: Self-pay

## 2014-04-10 NOTE — Telephone Encounter (Signed)
PT STATES SHE IS LEAVING FOR [place] ON Thursday AND WILL BE GONE FOR A WEEK, HER ADDERALL 30MG S ISN'T TO BE FILLED UNTIL THE 2ND, NEED THE DR TO CALL AND SAY IT IS OK TO FILL EARLIER. PLEASE CALL PT AT [phone]

## 2014-04-11 NOTE — Telephone Encounter (Signed)
Called pharm to authorize early RF and let pt know [facility]

## 2014-04-11 NOTE — Telephone Encounter (Signed)
Ok to fill early for trip--arrange this please

## 2014-05-14 ENCOUNTER — Encounter: Payer: Self-pay | Admitting: Internal Medicine

## 2014-05-14 ENCOUNTER — Other Ambulatory Visit: Payer: Self-pay | Admitting: Internal Medicine

## 2014-05-14 DIAGNOSIS — F909 Attention-deficit hyperactivity disorder, unspecified type: Secondary | ICD-10-CM

## 2014-05-15 MED ORDER — AMPHETAMINE-DEXTROAMPHETAMINE 30 MG PO TABS: 30.0000 mg | ORAL_TABLET | Freq: Two times a day (BID) | ORAL | Status: DC

## 2014-08-15 ENCOUNTER — Ambulatory Visit (INDEPENDENT_AMBULATORY_CARE_PROVIDER_SITE_OTHER): Payer: BC Managed Care – PPO | Admitting: Family Medicine

## 2014-08-15 VITALS — BP 110/76 | HR 70 | Temp 98.3°F | Resp 16 | Ht 69.0 in | Wt 168.6 lb

## 2014-08-15 DIAGNOSIS — F909 Attention-deficit hyperactivity disorder, unspecified type: Secondary | ICD-10-CM

## 2014-08-15 DIAGNOSIS — Z309 Encounter for contraceptive management, unspecified: Secondary | ICD-10-CM

## 2014-08-15 MED ORDER — AMPHETAMINE-DEXTROAMPHETAMINE 30 MG PO TABS: 30.0000 mg | ORAL_TABLET | Freq: Two times a day (BID) | ORAL | Status: DC

## 2014-08-15 MED ORDER — NORGESTIMATE-ETH ESTRADIOL 0.25-35 MG-MCG PO TABS: ORAL_TABLET | ORAL | Status: DC

## 2014-08-15 NOTE — Progress Notes (Signed)
Chief Complaint:  Chief Complaint  Patient presents with  . Medication Refill    needs refills of medication     HPI: Jasmine Coffey is a 23 y.o. 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 She changed her major and the person who was on eof her professors got her an intership and she was working there as an entry level after graduation but is now a Secretary/administrator!  She is aiming to see if she can get transferred in 2 years after her contract  Expires to go to [employer] [place], she really "needs to get out of [place] since she has been here all her life".  She is doing well on the medicines. No SI/HI/Hallucinations, no breakthorugh bleeding or HA  Or mood changes.   Past Medical History  Diagnosis Date  . Depression   . ADHD (attention deficit hyperactivity disorder)   . Asthma    Past Surgical History  Procedure Laterality Date  . Knee surgery    . Arthroscopic repair acl     History   Social History  . Marital Status: Married    Spouse Name: N/A    Number of Children: N/A  . Years of Education: N/A   Social History Main Topics  . Smoking status: Never Smoker   . Smokeless tobacco: Never Used  . Alcohol Use: 17.6 oz/week    8 Cans of beer, 8 Shots of liquor, 16 Drinks containing 0.5 oz of alcohol per week     Comment: SOCIAL  . Drug Use: No  . Sexual Activity: Yes    Birth Control/ Protection: None   Other Topics Concern  . None   Social History Narrative  . None   Family History  Problem Relation Age of Onset  . Diabetes Father   . Cancer Maternal Grandfather    Allergies  Allergen Reactions  . Codeine Nausea And Vomiting   Prior to Admission medications   Medication Sig Start Date End Date  Taking? Authorizing Provider  albuterol (PROVENTIL) (2.5 MG/3ML) 0.083% nebulizer solution Take 2.5 mg by nebulization every 6 (six) hours as needed.   Yes Historical Provider, MD  amphetamine-dextroamphetamine (ADDERALL) 30 MG tablet Take 1 tablet (30 mg total) by mouth 2 (two) times daily. 60 days after signed 05/15/14  Yes Leandrew Koyanagi, MD  clonazePAM (KLONOPIN) 0.5 MG tablet Take 1 tablet (0.5 mg total) by mouth at bedtime. 02/12/14  Yes Leandrew Koyanagi, MD  norgestimate-ethinyl estradiol (ORTHO-CYCLEN,SPRINTEC,PREVIFEM) 0.25-35 MG-MCG tablet Take an active pill daily for 6 months and then take 7 days off before restarting 03/14/14  Yes Leandrew Koyanagi, MD  amphetamine-dextroamphetamine (ADDERALL) 30 MG tablet Take 1 tablet (30 mg total) by mouth 2 (two) times daily. 30 days after signed 05/15/14   Leandrew Koyanagi, MD  amphetamine-dextroamphetamine (ADDERALL) 30 MG tablet Take 1 tablet (30 mg total) by mouth 2 (two) times daily. May refill on 01/09/2014 05/15/14   Leandrew Koyanagi, MD     ROS: The patient denies fevers, chills, night sweats, unintentional weight loss, chest pain, palpitations, wheezing, dyspnea on exertion, nausea, vomiting, abdominal pain, dysuria, hematuria, melena, numbness, weakness, or tingling.   All other systems have been  reviewed and were otherwise negative with the exception of those mentioned in the HPI and as above.    PHYSICAL EXAM: Filed Vitals:   08/15/14 1859  BP: 110/76  Pulse: 70  Temp: 98.3 F (36.8 C)  Resp: 16   Filed Vitals:   08/15/14 1859  Height: 5\' 9"  (1.753 m)  Weight: 168 lb 9.6 oz (76.476 kg)   Body mass index is 24.89 kg/(m^2).  General: Alert, no acute distress HEENT:  Normocephalic, atraumatic, oropharynx patent. EOMI, PERRLA Cardiovascular:  Regular rate and rhythm, no rubs murmurs or gallops.   radial pulse intact. No pedal edema.  Respiratory: Clear to auscultation bilaterally.  No wheezes, rales, or rhonchi.  No cyanosis,  no use of accessory musculature GI: No organomegaly, abdomen is soft and non-tender, positive bowel sounds.  No masses. Skin: No rashes. Neurologic: Facial musculature symmetric. Psychiatric: Patient is appropriate throughout our interaction. Lymphatic: No cervical lymphadenopathy Musculoskeletal: Gait intact.   LABS: Results for orders placed in visit on 03/14/14  GC/CHLAMYDIA PROBE AMP      Result Value Ref Range   CT Probe RNA NEGATIVE     GC Probe RNA NEGATIVE       EKG/XRAY:   Primary read interpreted by Dr. Marin Comment at [facility].   ASSESSMENT/PLAN: Encounter Diagnoses  Name Primary?  . Attention deficit hyperactivity disorder (ADHD), unspecified ADHD type Yes  . Encounter for contraceptive management, unspecified encounter    Refill meds for 3 months, she can return to pick up another 3 month rx of her adderall when she runs out. No need for another OV until 6 months from now. Advise to call/mychart me 1 week in advance so I can have scripts ready Refilled  birth control, she has been on it without complications she thinks she may only have 1 RF left F/u in 6 months   Gross sideeffects, risk and benefits, and alternatives of medications d/w patient. Patient is aware that all medications have potential sideeffects and we are unable to predict every sideeffect or drug-drug interaction that may occur.  Yarima Penman, [place], DO 08/15/2014 8:20 PM

## 2014-09-28 ENCOUNTER — Other Ambulatory Visit: Payer: Self-pay

## 2014-11-14 ENCOUNTER — Telehealth: Payer: Self-pay

## 2014-11-14 NOTE — Telephone Encounter (Signed)
Pt needs a refill on her adderall

## 2014-11-19 ENCOUNTER — Other Ambulatory Visit: Payer: Self-pay | Admitting: Family Medicine

## 2014-11-19 DIAGNOSIS — F909 Attention-deficit hyperactivity disorder, unspecified type: Secondary | ICD-10-CM

## 2014-11-19 MED ORDER — AMPHETAMINE-DEXTROAMPHETAMINE 30 MG PO TABS: 30.0000 mg | ORAL_TABLET | Freq: Two times a day (BID) | ORAL | Status: DC

## 2014-11-19 NOTE — Telephone Encounter (Signed)
Pt called in to check status and was advised Rx is ready in drawer.

## 2014-12-26 ENCOUNTER — Ambulatory Visit (INDEPENDENT_AMBULATORY_CARE_PROVIDER_SITE_OTHER): Payer: BLUE CROSS/BLUE SHIELD | Admitting: Urgent Care

## 2014-12-26 VITALS — BP 118/68 | HR 97 | Temp 98.3°F | Resp 16 | Ht 68.25 in | Wt 168.8 lb

## 2014-12-26 DIAGNOSIS — F909 Attention-deficit hyperactivity disorder, unspecified type: Secondary | ICD-10-CM

## 2014-12-26 DIAGNOSIS — G4700 Insomnia, unspecified: Secondary | ICD-10-CM

## 2014-12-26 DIAGNOSIS — R5383 Other fatigue: Secondary | ICD-10-CM

## 2014-12-26 DIAGNOSIS — M791 Myalgia, unspecified site: Secondary | ICD-10-CM

## 2014-12-26 DIAGNOSIS — F419 Anxiety disorder, unspecified: Secondary | ICD-10-CM

## 2014-12-26 LAB — COMPREHENSIVE METABOLIC PANEL
ALT: 18 U/L (ref 0–35)
AST: 24 U/L (ref 0–37)
Albumin: 4 g/dL (ref 3.5–5.2)
Alkaline Phosphatase: 39 U/L (ref 39–117)
BILIRUBIN TOTAL: 0.3 mg/dL (ref 0.2–1.2)
BUN: 10 mg/dL (ref 6–23)
CALCIUM: 9.1 mg/dL (ref 8.4–10.5)
CO2: 24 meq/L (ref 19–32)
Chloride: 106 mEq/L (ref 96–112)
Creat: 0.65 mg/dL (ref 0.50–1.10)
Glucose, Bld: 86 mg/dL (ref 70–99)
Potassium: 4.2 mEq/L (ref 3.5–5.3)
Sodium: 137 mEq/L (ref 135–145)
Total Protein: 6.6 g/dL (ref 6.0–8.3)

## 2014-12-26 LAB — TSH: TSH: 0.648 u[IU]/mL (ref 0.350–4.500)

## 2014-12-26 MED ORDER — CLONAZEPAM 0.5 MG PO TABS: 0.5000 mg | ORAL_TABLET | Freq: Every day | ORAL | Status: DC

## 2014-12-26 MED ORDER — FLUOXETINE HCL 20 MG PO TABS: 20.0000 mg | ORAL_TABLET | Freq: Every day | ORAL | Status: DC

## 2014-12-26 MED ORDER — AMPHETAMINE-DEXTROAMPHETAMINE 30 MG PO TABS: 30.0000 mg | ORAL_TABLET | Freq: Two times a day (BID) | ORAL | Status: DC

## 2014-12-26 NOTE — Progress Notes (Signed)
MRN: [id number] DOB: 1991/06/04  Subjective:   Jasmine Coffey is a 24 y.o. female presenting for sick visit, medication refills.  Sick visit - 2 week history of myalgias, chest congestion, neck pain, intermittent night sweats, sore throat worse in the morning. Has tried Tylenol, DayQuil, Mucinex. Denies itchy or watery eyes, ear pain, ear drainage, rhinorrhea, cough, fevers, chills, chest pain, shob, wheezing. Denies history of seasonal allergies. Admits history of exercise-induced albuterol, has not needed inhaler in 3 years.  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 Has been having increased stress and anxiety worse now in the past 4 months. Has "always had anxiety" but not like this. Has been worrying excessively about work, meeting deadlines, easily feels overwhelmed, has crying spells, fatigued every day, has difficulty falling asleep, averaging ~5 hours a night, normal is 8 hours. Has used Klonopin successfully in the past, ran out of prescription months ago.  ADHD - diagnosed ~2008, managed with Adderall 30mg  BID. Feels significant improvement with this medication. Is able to stay on task, follows through, maintains conversations well, no impulsivity, less procrastination, does not misplace important items like wallet or keys. Medication has been filled by Dr. Laney Pastor and Dr. Marin Comment in the past.  Does not smoke, social alcohol use on the weekends. Denies any other aggravating or relieving factors, no other questions or concerns.  Jasmine Coffey has a current medication list which includes the following prescription(s): amphetamine-dextroamphetamine, norgestimate-ethinyl estradiol, albuterol, amphetamine-dextroamphetamine, amphetamine-dextroamphetamine, and clonazepam.  She is allergic to codeine.  Jasmine Coffey  has a past medical history of Depression; ADHD (attention deficit hyperactivity disorder); and Asthma. Also  has past surgical  history that includes Knee surgery and Arthroscopic repair ACL.  ROS As in subjective.  Objective:   Vitals: BP 118/68 mmHg  Pulse 97  Temp(Src) 98.3 F (36.8 C) (Oral)  Resp 16  Ht 5' 8.25" (1.734 m)  Wt 168 lb 12.8 oz (76.567 kg)  BMI 25.46 kg/m2  SpO2 100%  Wt Readings from Last 3 Encounters:  12/26/14 168 lb 12.8 oz (76.567 kg)  08/15/14 168 lb 9.6 oz (76.476 kg)  03/14/14 168 lb 6.4 oz (76.386 kg)   Physical Exam  Constitutional: She is oriented to person, place, and time and well-developed, well-nourished, and in no distress.  HENT:  TM's pearly and intact bilaterally, no effusions or erythema. Nasal turbinates pink and moist, no edema, minimal clear rhinorrhea. Oropharynx clear, without exudates, tonsillar edema. Mucous membranes moist.  Eyes: Conjunctivae and EOM are normal. Right eye exhibits no discharge. Left eye exhibits no discharge. No scleral icterus.  Neck: Normal range of motion. No thyromegaly present.  Cardiovascular: Normal rate, regular rhythm, normal heart sounds and intact distal pulses.  Exam reveals no gallop and no friction rub.   No murmur heard. Pulmonary/Chest: Effort normal and breath sounds normal. No respiratory distress. She has no wheezes. She has no rales. She exhibits no tenderness.  Abdominal: Soft. Bowel sounds are normal. She exhibits no distension and no mass. There is tenderness (diffusely tender throughout).  Musculoskeletal:  Spasms throughout trapezius bilaterally. Non-tender, no crepitus, excellent neck ROM.  Lymphadenopathy:    She has no cervical adenopathy.  Neurological: She is alert and oriented to person, place, and time.  Skin: Skin is warm and dry. No rash noted. No erythema.  Psychiatric: Mood and affect normal.   Assessment and Plan :   1. Anxiety 2. Insomnia - Increased anxiety possibly mixed depression secondary  to stressors at work, will start trial of Prozac 20mg , consider increasing at follow up 4-6 weeks.  Discussed potential for adverse effects, patient agreed to trial of Prozac - Refilled Klonopin for insomnia, emphasized good sleep hygiene - Consider adverse effects of Adderall despite longstanding treatment with Adderall, possible switch to non-stimulant medication  3. Myalgia 4. Other fatigue - likely to to lack of sleep, will rx flexeril if no improvement with klonopin as this may help with spasms and sleep - patient has some complaints of URI type symptoms but on exam, no obvious findings of URI, symptoms may be more related to insomnia, fatigue - will follow up on progress in 2-3 days, also discuss labs at that time  5. Attention deficit hyperactivity disorder (ADHD), unspecified ADHD type - stable, continue Adderall for now, will revisit this switching medication if no improvement in anxiety and depressive symptoms  Jasmine Eagles, PA-C [facility] [phone] 12/26/2014 4:45 PM

## 2014-12-27 LAB — CBC
HEMATOCRIT: 38.8 % (ref 36.0–46.0)
Hemoglobin: 13.5 g/dL (ref 12.0–15.0)
MCH: 31.7 pg (ref 26.0–34.0)
MCHC: 34.8 g/dL (ref 30.0–36.0)
MCV: 91.1 fL (ref 78.0–100.0)
MPV: 11.1 fL (ref 8.6–12.4)
Platelets: 213 10*3/uL (ref 150–400)
RBC: 4.26 MIL/uL (ref 3.87–5.11)
RDW: 12.6 % (ref 11.5–15.5)
WBC: 6.9 10*3/uL (ref 4.0–10.5)

## 2014-12-28 ENCOUNTER — Telehealth: Payer: Self-pay | Admitting: Urgent Care

## 2014-12-28 NOTE — Telephone Encounter (Signed)
Spoke with patient regarding results and progress. Labs were all wnl. States that she could not afford klonopin, which cost ~$200+. Will look into how much Flexeril will cost her and let me know if she is willing to try that for both muscle spasms, myalgias, as well as secondary effect of sleep. Also, patient did not fill Prozac, spoke with mother and sister both of whom tried Prozac and states that it made them "psychotic". She has tried Zoloft in the past and this did not work for her despite an adequate trial. I offered Celexa as this is a medication that Dr. Marin Comment has considered in the past, 2013. She is reluctant stating that she does not want to be on medication long-term, does not like the idea of being dependent on medication. I discussed this with the patient emphasizing that with her anxiety level, currently the highest it has ever been, together with mixed symptoms of depression, she may benefit from at the very least trying Celexa for a period of 6 weeks. Patient states that 2 siblings responded well with Xanax but she will consider trying Celexa. She plans on getting back to me after she figures out cost of Flexeril.  Jaynee Eagles, PA-C [facility] [phone] 12/28/2014  1:52 PM

## 2014-12-28 NOTE — Telephone Encounter (Signed)
Go to goodrx.com #30 .5 clonazepam 17$---less w/ coupon Her whole family has this depression/anxiety and 2 of them responded to zoloft--when did she stop it?? And what was her max dose?? I wouldn't give up below 150mg  for her If she doesn't want it long term then only 2 choices---therapy with anx expert like bob milan or panic attack treatment like xanax

## 2014-12-31 NOTE — Telephone Encounter (Signed)
Please call patient back [phone]) and instruct her to print coupon from good [email or web address]. Let her know that 0.5mg  of clonazepam should be much more affordable there. Thank you!  Jaynee Eagles, PA-C [facility] [phone] 12/31/2014  4:08 PM

## 2015-01-01 ENCOUNTER — Telehealth: Payer: Self-pay

## 2015-01-01 NOTE — Telephone Encounter (Signed)
PA needed for Sprintec. Pt uses for both birth control and PMDD (N94.3). Completed covermymeds. Pending.

## 2015-01-01 NOTE — Telephone Encounter (Signed)
LM for pt- Put Good [facility] along with the other discount cards available to the office for her ready for pick up in drawer.

## 2015-01-03 NOTE — Telephone Encounter (Signed)
PA approved through 01/02/16. Notified pharmacy.

## 2015-02-27 ENCOUNTER — Other Ambulatory Visit: Payer: Self-pay | Admitting: Urgent Care

## 2015-02-27 ENCOUNTER — Encounter: Payer: Self-pay | Admitting: Internal Medicine

## 2015-02-27 ENCOUNTER — Other Ambulatory Visit: Payer: Self-pay | Admitting: Family Medicine

## 2015-02-27 DIAGNOSIS — F909 Attention-deficit hyperactivity disorder, unspecified type: Secondary | ICD-10-CM

## 2015-02-27 DIAGNOSIS — F411 Generalized anxiety disorder: Secondary | ICD-10-CM

## 2015-02-28 ENCOUNTER — Other Ambulatory Visit: Payer: Self-pay | Admitting: Family Medicine

## 2015-02-28 DIAGNOSIS — F411 Generalized anxiety disorder: Secondary | ICD-10-CM

## 2015-02-28 DIAGNOSIS — F909 Attention-deficit hyperactivity disorder, unspecified type: Secondary | ICD-10-CM

## 2015-02-28 MED ORDER — CLONAZEPAM 0.5 MG PO TABS: 0.5000 mg | ORAL_TABLET | Freq: Every day | ORAL | Status: DC

## 2015-02-28 MED ORDER — AMPHETAMINE-DEXTROAMPHETAMINE 30 MG PO TABS: 30.0000 mg | ORAL_TABLET | Freq: Two times a day (BID) | ORAL | Status: DC

## 2015-03-01 MED ORDER — AMPHETAMINE-DEXTROAMPHETAMINE 30 MG PO TABS: 30.0000 mg | ORAL_TABLET | Freq: Two times a day (BID) | ORAL | Status: DC

## 2015-03-01 MED ORDER — CLONAZEPAM 0.5 MG PO TABS: 0.5000 mg | ORAL_TABLET | Freq: Every day | ORAL | Status: DC

## 2015-04-17 ENCOUNTER — Ambulatory Visit (INDEPENDENT_AMBULATORY_CARE_PROVIDER_SITE_OTHER): Payer: BLUE CROSS/BLUE SHIELD | Admitting: Internal Medicine

## 2015-04-17 ENCOUNTER — Encounter: Payer: Self-pay | Admitting: Internal Medicine

## 2015-04-17 VITALS — BP 115/77 | HR 76 | Temp 98.9°F | Resp 16 | Ht 68.25 in | Wt 165.6 lb

## 2015-04-17 DIAGNOSIS — F909 Attention-deficit hyperactivity disorder, unspecified type: Secondary | ICD-10-CM | POA: Diagnosis not present

## 2015-04-17 DIAGNOSIS — N898 Other specified noninflammatory disorders of vagina: Secondary | ICD-10-CM

## 2015-04-17 DIAGNOSIS — F411 Generalized anxiety disorder: Secondary | ICD-10-CM | POA: Diagnosis not present

## 2015-04-17 MED ORDER — AMPHETAMINE-DEXTROAMPHETAMINE 30 MG PO TABS: 30.0000 mg | ORAL_TABLET | Freq: Two times a day (BID) | ORAL | Status: DC

## 2015-04-17 MED ORDER — CLONAZEPAM 0.5 MG PO TABS: 0.5000 mg | ORAL_TABLET | Freq: Every day | ORAL | Status: DC

## 2015-04-17 MED ORDER — AMPHETAMINE-DEXTROAMPHETAMINE 30 MG PO TABS: 30.0000 mg | ORAL_TABLET | Freq: Every day | ORAL | Status: DC

## 2015-04-17 MED ORDER — ALIGN 4 MG PO CAPS: 4.0000 mg | ORAL_CAPSULE | Freq: Every day | ORAL | Status: DC

## 2015-04-17 MED ORDER — ALIGN 4 MG PO CAPS: 4.0000 mg | ORAL_CAPSULE | Freq: Every day | ORAL | Status: AC

## 2015-04-17 NOTE — Progress Notes (Signed)
   Subjective:    Patient ID: Jasmine Coffey, female    DOB: May 25, 1991, 24 y.o.   MRN: [id number]  HPI follow-up Patient Active Problem List   Diagnosis Date Noted  . Depression with anxiety    . ADHD (attention deficit hyperactivity disorder)   . Contraception management 01/16/2012    Patient doing well on current meds. Takes adderall daily. Takes clonopin for sleep a couple of nights a week. Has a very irregular schedule and finds it difficult to consistently sleep well. Not having current depression symptoms.  Would like a prescription for Align so she can use her flexible spending account. Takes daily to help manage vaginal pH.  See past history of vaginal symptoms. She is not sexually active. Last PAP 04/24/2013.- normal.   Her family relationships are stable at this point.   Review of Systems No chest pain, no palpitations, no SOB.    no headaches or visual changes No tremors No excessive sweating  Objective:   Physical Exam Physical Exam  Vitals reviewed. Constitutional: She is oriented to person, place, and time. She appears well-developed and well-nourished.  HENT:  Head: Normocephalic and atraumatic.  Eyes: Conjunctivae are normal.  Neck: Normal range of motion. Neck supple.  Cardiovascular: Normal rate.   Pulmonary/Chest: Effort normal.  Musculoskeletal: Normal range of motion.  Neurological: She is alert and oriented to person, place, and time.  Skin: Skin is warm and dry.  Psychiatric: She has a normal mood and affect. Her behavior is normal. Judgment and thought content normal.  BP 115/77 mmHg  Pulse 76  Temp(Src) 98.9 F (37.2 C) (Oral)  Resp 16  Ht 5' 8.25" (1.734 m)  Wt 165 lb 9.6 oz (75.116 kg)  BMI 24.98 kg/m2  SpO2 99%    Assessment & Plan:  1. Generalized anxiety disorder - clonazePAM (KLONOPIN) 0.5 MG tablet; Take 1 tablet (0.5 mg total) by mouth at bedtime.  Dispense: 30 tablet; Refill: 0  2. Attention deficit hyperactivity disorder  (ADHD), unspecified ADHD type - amphetamine-dextroamphetamine (ADDERALL) 30 MG tablet; Take 1 tablet by mouth 2 (two) times daily.  Dispense: 60 tablet; Refill: 0 - amphetamine-dextroamphetamine (ADDERALL) 30 MG tablet; Take 1 tablet by mouth 2 (two) times daily. May fill 30 days after date on prescription.  Dispense: 60 tablet; Refill: 0 - amphetamine-dextroamphetamine (ADDERALL) 30 MG tablet; Take 1 tablet by mouth daily. May fill 60 days after date on prescription.  Dispense: 60 tablet; Refill: 0 - can call for three month refill  3. Vaginal discharge - Probiotic Product (ALIGN) 4 MG CAPS; Take 4 mg by mouth daily.  Dispense: 90 capsule; Refill: [place], FNP-BC  [facility]  04/17/2015 1:57 PM I have participated in the care of this patient with the Advanced Practice Provider and agree with Diagnosis and Plan as documented. Robert P. Laney Pastor, M.D.

## 2015-05-22 ENCOUNTER — Encounter: Payer: Self-pay | Admitting: Internal Medicine

## 2015-05-22 ENCOUNTER — Other Ambulatory Visit: Payer: Self-pay | Admitting: Family Medicine

## 2015-05-22 DIAGNOSIS — F411 Generalized anxiety disorder: Secondary | ICD-10-CM

## 2015-05-23 ENCOUNTER — Encounter: Payer: Self-pay | Admitting: Internal Medicine

## 2015-05-23 ENCOUNTER — Other Ambulatory Visit: Payer: Self-pay | Admitting: Family Medicine

## 2015-05-27 ENCOUNTER — Other Ambulatory Visit: Payer: Self-pay | Admitting: Family Medicine

## 2015-05-27 DIAGNOSIS — F909 Attention-deficit hyperactivity disorder, unspecified type: Secondary | ICD-10-CM

## 2015-05-27 DIAGNOSIS — F411 Generalized anxiety disorder: Secondary | ICD-10-CM

## 2015-05-27 MED ORDER — AMPHETAMINE-DEXTROAMPHETAMINE 30 MG PO TABS: 30.0000 mg | ORAL_TABLET | Freq: Two times a day (BID) | ORAL | Status: DC

## 2015-05-27 MED ORDER — CLONAZEPAM 0.5 MG PO TABS: 0.5000 mg | ORAL_TABLET | Freq: Every evening | ORAL | Status: DC | PRN

## 2015-06-19 ENCOUNTER — Other Ambulatory Visit: Payer: Self-pay | Admitting: Family Medicine

## 2015-06-19 ENCOUNTER — Encounter: Payer: Self-pay | Admitting: Internal Medicine

## 2015-06-19 DIAGNOSIS — F411 Generalized anxiety disorder: Secondary | ICD-10-CM

## 2015-06-20 MED ORDER — CLONAZEPAM 1 MG PO TABS: 1.0000 mg | ORAL_TABLET | Freq: Every evening | ORAL | Status: DC | PRN

## 2015-06-20 NOTE — Telephone Encounter (Signed)
incr K to 1 hs-f/u 3-6 wk

## 2015-06-21 NOTE — Telephone Encounter (Signed)
Rx called in 

## 2015-07-02 ENCOUNTER — Encounter: Payer: Self-pay | Admitting: Internal Medicine

## 2015-07-17 ENCOUNTER — Encounter: Payer: Self-pay | Admitting: Internal Medicine

## 2015-07-18 ENCOUNTER — Other Ambulatory Visit: Payer: Self-pay | Admitting: Physician Assistant

## 2015-07-18 DIAGNOSIS — F411 Generalized anxiety disorder: Secondary | ICD-10-CM

## 2015-07-18 DIAGNOSIS — F909 Attention-deficit hyperactivity disorder, unspecified type: Secondary | ICD-10-CM

## 2015-07-18 MED ORDER — AMPHETAMINE-DEXTROAMPHETAMINE 30 MG PO TABS: 30.0000 mg | ORAL_TABLET | Freq: Two times a day (BID) | ORAL | Status: DC

## 2015-07-18 MED ORDER — CLONAZEPAM 1 MG PO TABS: 1.0000 mg | ORAL_TABLET | Freq: Every evening | ORAL | Status: DC | PRN

## 2015-07-19 NOTE — Telephone Encounter (Signed)
Adderall in drawer, Klonopin faxed and pt notified on MyChart.

## 2015-07-30 ENCOUNTER — Encounter: Payer: Self-pay | Admitting: Internal Medicine

## 2015-08-01 ENCOUNTER — Other Ambulatory Visit: Payer: Self-pay | Admitting: Internal Medicine

## 2015-08-01 DIAGNOSIS — F909 Attention-deficit hyperactivity disorder, unspecified type: Secondary | ICD-10-CM

## 2015-08-01 MED ORDER — AMPHETAMINE-DEXTROAMPHETAMINE 30 MG PO TABS: 30.0000 mg | ORAL_TABLET | Freq: Every day | ORAL | Status: DC

## 2015-08-01 MED ORDER — AMPHETAMINE-DEXTROAMPHETAMINE 30 MG PO TABS: 30.0000 mg | ORAL_TABLET | Freq: Two times a day (BID) | ORAL | Status: DC

## 2015-08-02 NOTE — Telephone Encounter (Signed)
Rxs in drawer.

## 2015-08-10 ENCOUNTER — Telehealth: Payer: Self-pay

## 2015-08-10 DIAGNOSIS — F909 Attention-deficit hyperactivity disorder, unspecified type: Secondary | ICD-10-CM

## 2015-08-10 DIAGNOSIS — F411 Generalized anxiety disorder: Secondary | ICD-10-CM

## 2015-08-10 NOTE — Telephone Encounter (Signed)
Pt states that [facility] wrote a prescription for Adderall and clonazePAM Bobbye [place]) for Sept 4 however she starts a new job in [place] and her insurance will lapse this week. She wants to see if she can get her meds by this week and pick up possibly tomorrow? She doesn't know when her new insurance will take place.  Please advise [phone]

## 2015-08-11 ENCOUNTER — Telehealth: Payer: Self-pay | Admitting: Family Medicine

## 2015-08-11 MED ORDER — AMPHETAMINE-DEXTROAMPHETAMINE 30 MG PO TABS: 30.0000 mg | ORAL_TABLET | Freq: Two times a day (BID) | ORAL | Status: DC

## 2015-08-11 MED ORDER — CLONAZEPAM 1 MG PO TABS: 1.0000 mg | ORAL_TABLET | Freq: Every evening | ORAL | Status: DC | PRN

## 2015-08-11 NOTE — Telephone Encounter (Signed)
Ok Meds ordered this encounter  Medications  . clonazePAM (KLONOPIN) 1 MG tablet    Sig: Take 1 tablet (1 mg total) by mouth at bedtime as needed for anxiety.    Dispense:  30 tablet    Refill:  1  . amphetamine-dextroamphetamine (ADDERALL) 30 MG tablet    Sig: Take 1 tablet by mouth 2 (two) times daily. For 08/18/15    Dispense:  60 tablet    Refill:  0

## 2015-08-11 NOTE — Telephone Encounter (Signed)
lmom to pick RX up from the [facility]

## 2015-09-17 ENCOUNTER — Encounter: Payer: Self-pay | Admitting: Emergency Medicine

## 2015-10-23 ENCOUNTER — Encounter: Payer: Self-pay | Admitting: Internal Medicine

## 2015-10-23 DIAGNOSIS — F909 Attention-deficit hyperactivity disorder, unspecified type: Secondary | ICD-10-CM

## 2015-10-24 ENCOUNTER — Other Ambulatory Visit: Payer: Self-pay | Admitting: Internal Medicine

## 2015-10-24 MED ORDER — METRONIDAZOLE 0.75 % VA GEL: 1.0000 | Freq: Every day | VAGINAL | Status: DC

## 2015-10-24 MED ORDER — NORETHINDRONE-ETH ESTRADIOL 1-35 MG-MCG PO TABS: 1.0000 | ORAL_TABLET | Freq: Every day | ORAL | Status: DC

## 2015-10-24 NOTE — Progress Notes (Signed)
Meds ordered this encounter  Medications  . norethindrone-ethinyl estradiol 1/35 (ORTHO-NOVUM, NORTREL,CYCLAFEM) tablet    Sig: Take 1 tablet by mouth daily.    Dispense:  1 Package    Refill:  11  . metroNIDAZOLE (METROGEL) 0.75 % vaginal gel    Sig: Place 1 Applicatorful vaginally at bedtime. For 5 days then repeat treatment after each menses for 3 cycles    Dispense:  70 g    Refill:  3   Can refill add later this mo for 12/3

## 2015-11-01 MED ORDER — AMPHETAMINE-DEXTROAMPHETAMINE 30 MG PO TABS: 30.0000 mg | ORAL_TABLET | Freq: Two times a day (BID) | ORAL | Status: DC

## 2015-12-07 ENCOUNTER — Other Ambulatory Visit: Payer: Self-pay | Admitting: Internal Medicine

## 2015-12-15 ENCOUNTER — Other Ambulatory Visit: Payer: Self-pay | Admitting: Internal Medicine

## 2015-12-18 ENCOUNTER — Encounter: Payer: Self-pay | Admitting: Internal Medicine

## 2015-12-18 DIAGNOSIS — F909 Attention-deficit hyperactivity disorder, unspecified type: Secondary | ICD-10-CM

## 2015-12-20 MED ORDER — DESOGESTREL-ETHINYL ESTRADIOL 0.15-30 MG-MCG PO TABS: 1.0000 | ORAL_TABLET | Freq: Every day | ORAL | Status: DC

## 2015-12-20 NOTE — Telephone Encounter (Signed)
Meds ordered this encounter  Medications  . desogestrel-ethinyl estradiol (DESOGEN) 0.15-30 MG-MCG tablet    Sig: Take 1 tablet by mouth daily.    Dispense:  1 Package    Refill:  11   Call re others

## 2015-12-23 ENCOUNTER — Other Ambulatory Visit: Payer: Self-pay | Admitting: Internal Medicine

## 2015-12-23 MED ORDER — CLONAZEPAM 1 MG PO TABS: ORAL_TABLET | ORAL | Status: DC

## 2015-12-23 MED ORDER — AMPHETAMINE-DEXTROAMPHETAMINE 30 MG PO TABS: 30.0000 mg | ORAL_TABLET | Freq: Two times a day (BID) | ORAL | Status: DC

## 2015-12-23 NOTE — Telephone Encounter (Signed)
Meds ordered this encounter  Medications  . desogestrel-ethinyl estradiol (DESOGEN) 0.15-30 MG-MCG tablet    Sig: Take 1 tablet by mouth daily.    Dispense:  1 Package    Refill:  11  . amphetamine-dextroamphetamine (ADDERALL) 30 MG tablet    Sig: Take 1 tablet by mouth 2 (two) times daily.    Dispense:  60 tablet    Refill:  0  . amphetamine-dextroamphetamine (ADDERALL) 30 MG tablet    Sig: Take 1 tablet by mouth 2 (two) times daily. For 30d after signed    Dispense:  60 tablet    Refill:  0  . amphetamine-dextroamphetamine (ADDERALL) 30 MG tablet    Sig: Take 1 tablet by mouth 2 (two) times daily. For 60d after signed    Dispense:  60 tablet    Refill:  0  . clonazePAM (KLONOPIN) 1 MG tablet    Sig: TAKE 1 TABLET BY MOUTH AT BEDTIME AS NEEDED FOR ANXIETY    Dispense:  30 tablet    Refill:  2

## 2016-01-29 ENCOUNTER — Encounter: Payer: Self-pay | Admitting: Internal Medicine

## 2016-02-01 ENCOUNTER — Ambulatory Visit (INDEPENDENT_AMBULATORY_CARE_PROVIDER_SITE_OTHER): Payer: Managed Care, Other (non HMO) | Admitting: Internal Medicine

## 2016-02-01 VITALS — BP 110/80 | HR 91 | Temp 98.7°F | Resp 18 | Ht 68.0 in | Wt 157.5 lb

## 2016-02-01 DIAGNOSIS — F909 Attention-deficit hyperactivity disorder, unspecified type: Secondary | ICD-10-CM

## 2016-02-01 DIAGNOSIS — F32A Depression, unspecified: Secondary | ICD-10-CM

## 2016-02-01 DIAGNOSIS — R5383 Other fatigue: Secondary | ICD-10-CM | POA: Diagnosis not present

## 2016-02-01 DIAGNOSIS — F329 Major depressive disorder, single episode, unspecified: Secondary | ICD-10-CM | POA: Diagnosis not present

## 2016-02-01 DIAGNOSIS — F411 Generalized anxiety disorder: Secondary | ICD-10-CM

## 2016-02-01 DIAGNOSIS — A499 Bacterial infection, unspecified: Secondary | ICD-10-CM | POA: Diagnosis not present

## 2016-02-01 DIAGNOSIS — N760 Acute vaginitis: Secondary | ICD-10-CM

## 2016-02-01 DIAGNOSIS — B9689 Other specified bacterial agents as the cause of diseases classified elsewhere: Secondary | ICD-10-CM

## 2016-02-01 DIAGNOSIS — Z0000 Encounter for general adult medical examination without abnormal findings: Secondary | ICD-10-CM | POA: Diagnosis not present

## 2016-02-01 MED ORDER — CLONAZEPAM 1 MG PO TABS: 0.5000 mg | ORAL_TABLET | Freq: Two times a day (BID) | ORAL | Status: DC | PRN

## 2016-02-01 MED ORDER — METRONIDAZOLE 0.75 % VA GEL: 1.0000 | Freq: Every day | VAGINAL | Status: DC

## 2016-02-01 NOTE — Patient Instructions (Signed)
Overcoming anxiety for dummies  PAs here-Sarah Weber and [employer]

## 2016-02-01 NOTE — Progress Notes (Signed)
Subjective:    Patient ID: Jasmine Coffey, female    DOB: 1991/04/21, 25 y.o.   MRN: [id number] By signing my name below, I, [name], attest that this documentation has been prepared under the direction and in the presence of Tami Lin, MD.  Electronically Signed: [name], Medical Scribe. 02/01/2016. 3:34 PM.  HPI HPI Comments: Jasmine Coffey is a 25 y.o. female with a history of ADHD who presents to the [facility] for a complete physical exam. Patient presents with paperwork to fill out.   Patient Active Problem List   Diagnosis Date Noted  . Depression w/ anxiety   . ADHD (attention deficit hyperactivity disorder)--dx 2008   . Contraception management--PAP 5/14 01/16/2012    -  Hx asthma  Klonopin has been helping with stress and sleep-started 2015 with success. She has not had any recent problems with her asthma. No depression recently. Note strong fam hx of depr. Has been on celexa/zoloft in past.  Current outpatient prescriptions:  .  albuterol (PROVENTIL) (2.5 MG/3ML) 0.083% nebulizer solution, Take 2.5 mg by nebulization every 6 (six) hours as needed., Disp: , Rfl:  .  amphetamine-dextroamphetamine (ADDERALL) 30 MG tablet, Take 1 tablet by mouth 2 (two) times daily., Disp: 60 tablet, Rfl: 0 .  clonazePAM (KLONOPIN) 1 MG tablet, Take 0.5-1 tablets (0.5-1 mg total) by mouth 2 (two) times daily as needed for anxiety. TAKE 1 TABLET BY MOUTH AT BEDTIME AS NEEDED FOR ANXIETY,  .  metroNIDAZOLE (METROGEL) 0.75 % vaginal gel, Place 1 Applicatorful vaginally at bedtime. For 5 days then repeat treatment after each menses for 3 cycles, Disp: 70 g, Rfl: 3 .  Probiotic Product (ALIGN) 4 MG CAPS, Take 4 mg by mouth daily., Disp: 90 capsule, Rfl: 3 - OCPs  Patient notes for the past 6 months, she has been waking up with increased fatigue and grogginess. She believes this is due to stress and excessive worrying. She tries to run every morning. Patient does  have some irritability with Adderall, but denies other side effects, including headaches and palpitations. Working for [employer] in [place] at [employer]- is very stressful/overloaded/doing job of 3-4 people.  She takes Electronics engineer every day for recurrence of nonspecific vaginitis; she believes it is working, but she is unsure. Still has odor/different and annoying, following IC for days and sometimes after menses. Using 28mo of pills then menses. metrogel vag helped a little. Has not had recent wet prep but hass had several positives in past. She has also tried other remedies such as apple cider vinegar. She believes sexual activity makes this worse.   She is thinking about working in [place], where her boyfriend lives, for a year, but her ultimate goal is to work in [place] or [place].   Review of Systems  Constitutional: Positive for fatigue. Negative for fever, activity change, appetite change and unexpected weight change.       Still complains of being tired often. Has had this on and off for years negative medical workup. Her sleep cycle is poor because of all the stress and work.  HENT: Negative for hearing loss and trouble swallowing.   Eyes: Negative for visual disturbance.  Respiratory: Negative for cough, chest tightness and shortness of breath.        Asthma stable recently  Cardiovascular: Negative for chest pain, palpitations and leg swelling.  Gastrointestinal: Negative for abdominal pain, diarrhea and constipation.  Genitourinary: Negative for urgency, difficulty urinating and menstrual problem.  Musculoskeletal:  Negative for back pain, arthralgias and gait problem.  Neurological: Negative for dizziness, numbness and headaches.  Hematological: Negative for adenopathy. Does not bruise/bleed easily.  Psychiatric/Behavioral: Negative for hallucinations, confusion and self-injury. The patient is not hyperactive.        Objective:   Physical Exam  Constitutional: She is  oriented to person, place, and time. She appears well-developed and well-nourished. No distress.  HENT:  Head: Normocephalic.  Right Ear: External ear normal.  Left Ear: External ear normal.  Nose: Nose normal.  Mouth/Throat: Oropharynx is clear and moist.  Eyes: Conjunctivae and EOM are normal. Pupils are equal, round, and reactive to light.  Neck: Normal range of motion. Neck supple. No thyromegaly present.  Cardiovascular: Normal rate, regular rhythm, normal heart sounds and intact distal pulses.   No murmur heard. Pulmonary/Chest: Effort normal and breath sounds normal. She has no wheezes.  Abdominal: Soft. Bowel sounds are normal. She exhibits no distension and no mass. There is no tenderness. There is no rebound.  Musculoskeletal: Normal range of motion. She exhibits no edema or tenderness.  Lymphadenopathy:    She has no cervical adenopathy.  Neurological: She is alert and oriented to person, place, and time. She has normal reflexes. No cranial nerve deficit.  Skin: Skin is warm and dry. No rash noted.  Psychiatric: She has a normal mood and affect. Her behavior is normal. Judgment and thought content normal.  Nursing note and vitals reviewed.  BP 110/80 mmHg  Pulse 91  Temp(Src) 98.7 F (37.1 C) (Oral)  Resp 18  Ht 5\' 8"  (1.727 m)  Wt 157 lb 8 oz (71.442 kg)  BMI 23.95 kg/m2  SpO2 99%     Assessment & Plan:  Annual physical exam -lipids  Attention deficit hyperactivity disorder (ADHD), unspecified ADHD type-she has medication for now and will call for 3 months prescriptions in late March  Generalized anxiety disorder--continue Klonopin/discuss overcoming anxiety for dummies/discuss need for career change/need for counseling  Other fatigue--cmet/cbc/tsh  BV (bacterial vaginosis)--we'll continue occasional MetroGel plus probiotics daily  Depression--not currently active though this could be causing fatigue in a subtle way//chooses not to start back on SSRIs for  now   Reviewing paper chart for last tetanus Meds ordered this encounter  Medications  . clonazePAM (KLONOPIN) 1 MG tablet    Sig: Take 0.5-1 tablets (0.5-1 mg total) by mouth 2 (two) times daily as needed for anxiety. TAKE 1 TABLET BY MOUTH AT BEDTIME AS NEEDED FOR ANXIETY    Dispense:  60 tablet    Refill:  5  . metroNIDAZOLE (METROGEL) 0.75 % vaginal gel    Sig: Place 1 Applicatorful vaginally at bedtime. For 5 days then repeat treatment after each menses for 3 cycles    Dispense:  70 g    Refill:  3     I have completed the patient encounter in its entirety as documented by the scribe, with editing by me where necessary. Seeley Hissong P. Laney Pastor, M.D.

## 2016-02-02 LAB — TSH: TSH: 0.85 m[IU]/L

## 2016-02-02 LAB — CBC WITH DIFFERENTIAL/PLATELET
Basophils Absolute: 0 10*3/uL (ref 0.0–0.1)
Basophils Relative: 0 % (ref 0–1)
EOS PCT: 2 % (ref 0–5)
Eosinophils Absolute: 0.1 10*3/uL (ref 0.0–0.7)
HEMATOCRIT: 40 % (ref 36.0–46.0)
Hemoglobin: 13.7 g/dL (ref 12.0–15.0)
LYMPHS PCT: 26 % (ref 12–46)
Lymphs Abs: 1.6 10*3/uL (ref 0.7–4.0)
MCH: 31.5 pg (ref 26.0–34.0)
MCHC: 34.3 g/dL (ref 30.0–36.0)
MCV: 92 fL (ref 78.0–100.0)
MONO ABS: 0.4 10*3/uL (ref 0.1–1.0)
MONOS PCT: 6 % (ref 3–12)
MPV: 12.1 fL (ref 8.6–12.4)
Neutro Abs: 4 10*3/uL (ref 1.7–7.7)
Neutrophils Relative %: 66 % (ref 43–77)
Platelets: 204 10*3/uL (ref 150–400)
RBC: 4.35 MIL/uL (ref 3.87–5.11)
RDW: 11.8 % (ref 11.5–15.5)
WBC: 6 10*3/uL (ref 4.0–10.5)

## 2016-02-02 LAB — LIPID PANEL
CHOL/HDL RATIO: 2.5 ratio (ref <=5.0)
Cholesterol: 145 mg/dL (ref 125–200)
HDL: 58 mg/dL (ref >=46)
LDL CALC: 60 mg/dL (ref <130)
TRIGLYCERIDES: 136 mg/dL (ref <150)
VLDL: 27 mg/dL (ref <30)

## 2016-02-02 LAB — COMPREHENSIVE METABOLIC PANEL
ALT: 10 U/L (ref 6–29)
AST: 18 U/L (ref 10–30)
Albumin: 4.1 g/dL (ref 3.6–5.1)
Alkaline Phosphatase: 34 U/L (ref 33–115)
BILIRUBIN TOTAL: 0.5 mg/dL (ref 0.2–1.2)
BUN: 15 mg/dL (ref 7–25)
CALCIUM: 8.7 mg/dL (ref 8.6–10.2)
CHLORIDE: 105 mmol/L (ref 98–110)
CO2: 24 mmol/L (ref 20–31)
Creat: 0.72 mg/dL (ref 0.50–1.10)
GLUCOSE: 89 mg/dL (ref 65–99)
POTASSIUM: 4.3 mmol/L (ref 3.5–5.3)
Sodium: 136 mmol/L (ref 135–146)
Total Protein: 6.6 g/dL (ref 6.1–8.1)

## 2016-02-10 ENCOUNTER — Encounter: Payer: Self-pay | Admitting: Internal Medicine

## 2016-03-26 ENCOUNTER — Other Ambulatory Visit: Payer: Self-pay

## 2016-03-26 ENCOUNTER — Telehealth: Payer: Self-pay | Admitting: Family Medicine

## 2016-03-26 MED ORDER — NORETHINDRONE-ETH ESTRADIOL 1-35 MG-MCG PO TABS: ORAL_TABLET | ORAL | Status: DC

## 2016-03-26 NOTE — Telephone Encounter (Signed)
[facility] called and wanted to know about the new rx for a different kind of birth control.

## 2016-03-26 NOTE — Telephone Encounter (Signed)
Pharm req'd Rx w/clarified directions so ins will pay. Sent in new Rx as Dr Laney Pastor wrote in his OV notes 02/01/16.

## 2016-03-29 ENCOUNTER — Encounter: Payer: Self-pay | Admitting: Internal Medicine

## 2016-03-29 DIAGNOSIS — F909 Attention-deficit hyperactivity disorder, unspecified type: Secondary | ICD-10-CM

## 2016-04-01 MED ORDER — AMPHETAMINE-DEXTROAMPHETAMINE 30 MG PO TABS: 30.0000 mg | ORAL_TABLET | Freq: Two times a day (BID) | ORAL | Status: DC

## 2016-04-01 NOTE — Telephone Encounter (Signed)
Meds ordered this encounter  Medications  . amphetamine-dextroamphetamine (ADDERALL) 30 MG tablet    Sig: Take 1 tablet by mouth 2 (two) times daily.    Dispense:  60 tablet    Refill:  0  . amphetamine-dextroamphetamine (ADDERALL) 30 MG tablet    Sig: Take 1 tablet by mouth 2 (two) times daily. For 30d after signed    Dispense:  60 tablet    Refill:  0  . amphetamine-dextroamphetamine (ADDERALL) 30 MG tablet    Sig: Take 1 tablet by mouth 2 (two) times daily. For 60d after signed    Dispense:  60 tablet    Refill:  0

## 2016-04-02 NOTE — Telephone Encounter (Signed)
Pt hadn't responded to Dr Doolittle's message so I sent another to pt asking her correct address to mail Rxs or if she wants to p/up. Rxs are currently in p/up drawer waiting for response.

## 2016-06-19 ENCOUNTER — Encounter: Payer: Self-pay | Admitting: Internal Medicine

## 2016-06-19 DIAGNOSIS — F909 Attention-deficit hyperactivity disorder, unspecified type: Secondary | ICD-10-CM

## 2016-06-20 ENCOUNTER — Encounter: Payer: Self-pay | Admitting: Internal Medicine

## 2016-06-20 MED ORDER — CLONAZEPAM 1 MG PO TABS: 0.5000 mg | ORAL_TABLET | Freq: Two times a day (BID) | ORAL | Status: DC | PRN

## 2016-06-20 MED ORDER — AMPHETAMINE-DEXTROAMPHETAMINE 30 MG PO TABS: 30.0000 mg | ORAL_TABLET | Freq: Two times a day (BID) | ORAL | Status: DC

## 2016-06-20 NOTE — Telephone Encounter (Signed)
meds refilled for pickup Meds ordered this encounter  Medications  . amphetamine-dextroamphetamine (ADDERALL) 30 MG tablet    Sig: Take 1 tablet by mouth 2 (two) times daily.    Dispense:  60 tablet    Refill:  0  . amphetamine-dextroamphetamine (ADDERALL) 30 MG tablet    Sig: Take 1 tablet by mouth 2 (two) times daily. For 30d after signed    Dispense:  60 tablet    Refill:  0  . amphetamine-dextroamphetamine (ADDERALL) 30 MG tablet    Sig: Take 1 tablet by mouth 2 (two) times daily. For 60d after signed    Dispense:  60 tablet    Refill:  0  . clonazePAM (KLONOPIN) 1 MG tablet    Sig: Take 0.5-1 tablets (0.5-1 mg total) by mouth 2 (two) times daily as needed for anxiety. TAKE 1 TABLET BY MOUTH AT BEDTIME AS NEEDED FOR ANXIETY    Dispense:  60 tablet    Refill:  5   followup here in 3 months for new provider

## 2016-06-22 NOTE — Telephone Encounter (Signed)
Notified pt ready on mychart.

## 2016-08-19 ENCOUNTER — Encounter: Payer: Self-pay | Admitting: Emergency Medicine

## 2016-09-09 ENCOUNTER — Telehealth: Payer: Self-pay

## 2016-09-09 NOTE — Telephone Encounter (Signed)
Patient request a refill of birth control. Pharmacy sent over request. Patient need refill by tomorrow.   [phone].

## 2016-09-13 ENCOUNTER — Encounter: Payer: Self-pay | Admitting: Emergency Medicine

## 2016-09-13 ENCOUNTER — Telehealth: Payer: Self-pay | Admitting: Internal Medicine

## 2016-09-13 DIAGNOSIS — F909 Attention-deficit hyperactivity disorder, unspecified type: Secondary | ICD-10-CM

## 2016-09-14 NOTE — Telephone Encounter (Signed)
Pt should have RFs of this at the pharm. I advised pt of this on the MyChart message she sent and asked for her to get back in touch if they don't have the RFs sent on 4/13.

## 2016-09-14 NOTE — Telephone Encounter (Signed)
Pt should have RFs of her BCP at pharm and I gave her this info and asked her to check w/pharm and let us know if they still don't have it. Please advise on RF of adderall. 1 mos pended.

## 2016-09-16 NOTE — Telephone Encounter (Signed)
Left message with mom to have Jasmine Coffey call for an appt. This week to discuss refill.

## 2016-09-16 NOTE — Telephone Encounter (Signed)
RTC to establish with a new provider.  Philis Fendt, MS, PA-C 3:12 PM, 09/16/2016

## 2016-09-16 NOTE — Telephone Encounter (Signed)
01/2016 last cpe with lab dr Laney Pastor pcp.

## 2016-09-17 MED ORDER — NORETHINDRONE-ETH ESTRADIOL 1-35 MG-MCG PO TABS: ORAL_TABLET | ORAL | 1 refills | Status: DC

## 2016-09-17 NOTE — Addendum Note (Signed)
Addended by: Elwyn Reach A on: 09/17/2016 05:27 PM   Modules accepted: Orders

## 2016-09-17 NOTE — Telephone Encounter (Signed)
Sent in the remaining RFs of BCPs Dr Laney Pastor had given pt since she reported the pharm did not have a record of them. Pt will RTC this weekend for other med RFs.

## 2016-09-19 ENCOUNTER — Ambulatory Visit (INDEPENDENT_AMBULATORY_CARE_PROVIDER_SITE_OTHER): Payer: Managed Care, Other (non HMO) | Admitting: Family Medicine

## 2016-09-19 VITALS — BP 100/76 | HR 86 | Temp 98.3°F | Ht 68.0 in | Wt 151.6 lb

## 2016-09-19 DIAGNOSIS — F419 Anxiety disorder, unspecified: Secondary | ICD-10-CM | POA: Diagnosis not present

## 2016-09-19 DIAGNOSIS — Z3009 Encounter for other general counseling and advice on contraception: Secondary | ICD-10-CM

## 2016-09-19 DIAGNOSIS — F909 Attention-deficit hyperactivity disorder, unspecified type: Secondary | ICD-10-CM

## 2016-09-19 MED ORDER — AMPHETAMINE-DEXTROAMPHETAMINE 30 MG PO TABS: 30.0000 mg | ORAL_TABLET | Freq: Two times a day (BID) | ORAL | 0 refills | Status: DC

## 2016-09-19 MED ORDER — CLONAZEPAM 1 MG PO TABS: 0.5000 mg | ORAL_TABLET | Freq: Two times a day (BID) | ORAL | 3 refills | Status: DC | PRN

## 2016-09-19 NOTE — Progress Notes (Signed)
Chief Complaint  Patient presents with  . Medication Refill    Adderall 30 mg, Klonopin 1 mg   ADHD and Anxiety Pt reports that she moves in from [place], [place] She reports that she is stable on her current dose of Adderall at 30mg  bid She denies using any stimulants like caffeine She previously saw Dr. Sonia Baller and Dr. Everlene Farrier She reports that she takes Klonopin 1mg  sometimes during the day and at night for anxiety and sleep.  She reports that she cannot sleep because her mind runs all the time.   Contraception Counseling: Patient presents for contraception counseling. The patient has no complaints today. The patient is sexually active. Pertinent past medical history: none. She is currently on 3 month OCPs but admits that she misses doses occasionally. Patient's last menstrual period was 08/06/2016 (approximate).   Allergies  Allergen Reactions  . Codeine Nausea And Vomiting   Past Medical History:  Diagnosis Date  . ADHD (attention deficit hyperactivity disorder)   . Asthma   . Depression     Social History   Social History  . Marital status: Married    Spouse name: N/A  . Number of children: N/A  . Years of education: N/A   Social History Main Topics  . Smoking status: Never Smoker  . Smokeless tobacco: Never Used  . Alcohol use 19.2 oz/week    8 Cans of beer, 8 Shots of liquor, 16 Standard drinks or equivalent per week     Comment: SOCIAL  . Drug use: No  . Sexual activity: Yes    Birth control/ protection: None   Other Topics Concern  . None   Social History Narrative  . None    Review of Systems  HENT: Negative for tinnitus.   Eyes: Negative for blurred vision.  Cardiovascular: Negative for chest pain and palpitations.  Neurological: Negative for dizziness, tingling and tremors.  Psychiatric/Behavioral: The patient is nervous/anxious and has insomnia.    Vitals:   09/19/16 1011  BP: 100/76  Pulse: 86  Temp: 98.3 F (36.8 C)  TempSrc: Oral  Weight:  151 lb 9.6 oz (68.8 kg)  Height: 5\' 8"  (1.727 m)    Physical Exam  Constitutional: She is oriented to person, place, and time. She appears well-developed and well-nourished.  HENT:  Head: Normocephalic and atraumatic.  Eyes: Conjunctivae are normal. Pupils are equal, round, and reactive to light.  Cardiovascular: Normal rate, regular rhythm and normal heart sounds.   Pulmonary/Chest: Effort normal and breath sounds normal. No respiratory distress. She has no wheezes. She has no rales.  Neurological: She is alert and oriented to person, place, and time.  No gross tremors  Skin: She is not diaphoretic.   Assessment and Plan Jasmine Coffey was seen today for medication refill.  Diagnoses and all orders for this visit:  Attention deficit hyperactivity disorder (ADHD), unspecified ADHD type Anxiety Discussed that she should considered a different combination of classes of meds in the future since she is on a benzodiazepine with a stimulant.  Pt will continue her current plan but will consider psychiatry in the future to discuss novel drugs to help treat both.  Stable on current meds Reviewed risks and benefits Will continue current dose Plan for 3 month follow up  Contraception Pt on 3 month OCPs  Reports that she is interested in IUD Will call insurance and return for IUD in 3 months  Pt counseling today for various options of contraception and would be a good candidate for IUD  [place]

## 2016-09-19 NOTE — Patient Instructions (Addendum)
IF you received an x-ray today, you will receive an invoice from [facility]. Please contact [facility] at [phone] with questions or concerns regarding your invoice.   IF you received labwork today, you will receive an invoice from [employer]. Please contact [facility] at [phone] with questions or concerns regarding your invoice.   Our billing staff will not be able to assist you with questions regarding bills from these companies.  You will be contacted with the lab results as soon as they are available. The fastest way to get your results is to activate your My Chart account. Instructions are located on the last page of this paperwork. If you have not heard from [place] regarding the results in 2 weeks, please contact this office.     Contraception Choices Birth control (contraception) is the use of any methods or devices to stop pregnancy from happening. Below are some methods to help avoid pregnancy. HORMONAL BIRTH CONTROL  A small tube put under the skin of the upper arm (implant). The tube can stay in place for 3 years. The implant must be taken out after 3 years.  Shots given every 3 months.  Pills taken every day.  Patches that are changed once a week.  A ring put into the vagina (vaginal ring). The ring is left in place for 3 weeks and removed for 1 week. Then, a new ring is put in the vagina.  Emergency birth control pills taken after unprotected sex (intercourse). BARRIER BIRTH CONTROL   A thin covering worn on the penis (female condom) during sex.  A soft, loose covering put into the vagina (female condom) before sex.  A rubber bowl that sits over the cervix (diaphragm). The bowl must be made for you. The bowl is put into the vagina before sex. The bowl is left in place for 6 to 8 hours after sex.  A small, soft cup that fits over the cervix (cervical cap). The cup must be made for you. The cup can be left in place  for 48 hours after sex.  A sponge that is put into the vagina before sex.  A chemical that kills or stops sperm from getting into the cervix and uterus (spermicide). The chemical may be a cream, jelly, foam, or pill. INTRAUTERINE (IUD) BIRTH CONTROL   IUD birth control is a small, T-shaped piece of plastic. The plastic is put inside the uterus. There are 2 types of IUD:  Copper IUD. The IUD is covered in copper wire. The copper makes a fluid that kills sperm. It can stay in place for 10 years.  Hormone IUD. The hormone stops pregnancy from happening. It can stay in place for 5 years. PERMANENT METHODS  When the woman has her fallopian tubes sealed, tied, or blocked during surgery. This stops the egg from traveling to the uterus.  The doctor places a small coil or insert into each fallopian tube. This causes scar tissue to form and blocks the fallopian tubes.  When the female has the tubes that carry sperm tied off (vasectomy). NATURAL FAMILY PLANNING BIRTH CONTROL   Natural family planning means not having sex or using barrier birth control on the days the woman could become pregnant.  Use a calendar to keep track of the length of each period and know the days she can get pregnant.  Avoid sex during ovulation.  Use a thermometer to measure body temperature. Also watch for symptoms of ovulation.  Time sex  to be after the woman has ovulated. Use condoms to help protect yourself against sexually transmitted infections (STIs). Do this no matter what type of birth control you use. Talk to your doctor about which type of birth control is best for you.   This information is not intended to replace advice given to you by your health care provider. Make sure you discuss any questions you have with your health care provider.   Document Released: 09/27/2009 Document Revised: 12/05/2013 Document Reviewed: 06/21/2013 Elsevier Interactive Patient Education [employer].

## 2017-01-16 ENCOUNTER — Ambulatory Visit (INDEPENDENT_AMBULATORY_CARE_PROVIDER_SITE_OTHER): Payer: Managed Care, Other (non HMO) | Admitting: Family Medicine

## 2017-01-16 VITALS — BP 108/72 | HR 80 | Temp 98.1°F | Resp 16 | Ht 68.0 in | Wt 160.8 lb

## 2017-01-16 DIAGNOSIS — N939 Abnormal uterine and vaginal bleeding, unspecified: Secondary | ICD-10-CM

## 2017-01-16 DIAGNOSIS — F411 Generalized anxiety disorder: Secondary | ICD-10-CM | POA: Diagnosis not present

## 2017-01-16 DIAGNOSIS — Z3009 Encounter for other general counseling and advice on contraception: Secondary | ICD-10-CM

## 2017-01-16 DIAGNOSIS — F909 Attention-deficit hyperactivity disorder, unspecified type: Secondary | ICD-10-CM | POA: Diagnosis not present

## 2017-01-16 MED ORDER — AMPHETAMINE-DEXTROAMPHETAMINE 30 MG PO TABS: 30.0000 mg | ORAL_TABLET | Freq: Two times a day (BID) | ORAL | 0 refills | Status: DC

## 2017-01-16 MED ORDER — CLONAZEPAM 1 MG PO TABS: 0.5000 mg | ORAL_TABLET | Freq: Two times a day (BID) | ORAL | 0 refills | Status: DC | PRN

## 2017-01-16 NOTE — Patient Instructions (Addendum)
Check into insurance coverage and return to discuss IUD with Dr. Nolon Rod, or with medical provider in [place].   As we discussed you are currently on a higher than recommended dose of Adderall. Max dose per day is 40mg , and I also have some concerns with insomnia with taking stimulant in the afternoon. I can refill that dose today for 1 month, but will need to either follow up with Dr. Nolon Rod to discuss this medication, meet with psychiatry or other ADD specialist. One option would be to decrease to 10mg  afternoon dose.   For anxiety I will refill the klonopin, but would recommend another SSRI daily to lessen need for klonopin. Decreasing the afternoon dose of Adderall may be another option to see if that lessens your difficulty sleeping.   Let us know if we need to send records to a provider in [place].   Return to the clinic or go to the nearest emergency room if any of your symptoms worsen or new symptoms occur.   Policy for Prescribing Controlled Substances (Revised 10/2012) 1. Prescriptions for controlled substances will be filled by ONE provider at [facility] with whom you have established and developed a plan for your care, including follow-up. 2. You are encouraged to schedule an appointment with your prescriber at our appointment center for follow-up visits whenever possible. 3. If you request a prescription for the controlled substance while at [place] [facility] for an acute problem (with someone other than your regular prescriber), you MAY be given a ONE-TIME prescription for a 30-day supply of the controlled substance, to allow time for you to return to see your regular prescriber for additional prescriptions.     Intrauterine Device Information Introduction An intrauterine device (IUD) is a medical device that gets inserted into the uterus to prevent pregnancy. It is a small, T-shaped device that has one or two nylon strings hanging down from it. The  strings hang out of the lower part of the uterus (cervix) to allow for future IUD removal. There are two types of IUDs available:  Copper IUD. This type of IUD has copper wire wrapped around it. A copper IUD may last up to 10 years.  Hormone IUD. This type of IUD is made of plastic and contains the hormone progestin (synthetic progesterone). A hormone IUD may last 3 to 5 years. IUDs are inserted through the vagina and placed into the uterus with a minor medical procedure. How does the IUD work? Copper in the copper IUD prevents pregnancy by making the uterus and fallopian tubes produce a fluid that kills sperm. Synthetic progesterone in hormonal IUD prevents pregnancy by:  Thickening cervical mucus to prevent sperm from entering the uterus.  Thinning the uterine lining to prevent implantation of a fertilized egg.  Weakening or killing sperm that get into the uterus. What are the advantages of an IUD?  IUDs are highly effective, reversible, long-acting, and low-maintenance.  There are no estrogen-related side effects.  An IUD can be used when breastfeeding.  IUDs are not associated with weight gain.  Advantages of the copper IUD are that:  It works immediately after insertion.  It does not interfere with your body's natural hormones.  It can be used for 10 years.  Advantages of the hormone IUD are that:  If it is inserted within 7 days of your period starting, it works immediately after insertion. If the hormone IUD is inserted at any other time in your cycle, you will need to use  a backup method of birth control for 7 days after insertion.  It can make menstrual periods lighter.  It can decrease menstrual cramping.  It can be used for 3 or 5 years. What are the disadvantages of an IUD?  The hormone IUD may cause irregular menstrual bleeding for a period of time after insertion.  The copper IUD can make your menstrual flow heavier and more painful.  You may experience  some pain during insertion, and cramping and vaginal bleeding after insertion. How is the IUD removed? Is the IUD right for me?  Your health care provider will make sure you are a good candidate for an IUD and will discuss side effects with you. This information is not intended to replace advice given to you by your health care provider. Make sure you discuss any questions you have with your health care provider. Document Released: 11/03/2004 Document Revised: 05/07/2016 Document Reviewed: 05/21/2013  2017 Elsevier Contraception Choices Contraception (birth control) is the use of any methods or devices to prevent pregnancy. Below are some methods to help avoid pregnancy. Hormonal methods  Contraceptive implant. This is a thin, plastic tube containing progesterone hormone. It does not contain estrogen hormone. Your health care provider inserts the tube in the inner part of the upper arm. The tube can remain in place for up to 3 years. After 3 years, the implant must be removed. The implant prevents the ovaries from releasing an egg (ovulation), thickens the cervical mucus to prevent sperm from entering the uterus, and thins the lining of the inside of the uterus.  Progesterone-only injections. These injections are given every 3 months by your health care provider to prevent pregnancy. This synthetic progesterone hormone stops the ovaries from releasing eggs. It also thickens cervical mucus and changes the uterine lining. This makes it harder for sperm to survive in the uterus.  Birth control pills. These pills contain estrogen and progesterone hormone. They work by preventing the ovaries from releasing eggs (ovulation). They also cause the cervical mucus to thicken, preventing the sperm from entering the uterus. Birth control pills are prescribed by a health care provider.Birth control pills can also be used to treat heavy periods.  Minipill. This type of birth control pill contains only the  progesterone hormone. They are taken every day of each month and must be prescribed by your health care provider.  Birth control patch. The patch contains hormones similar to those in birth control pills. It must be changed once a week and is prescribed by a health care provider.  Vaginal ring. The ring contains hormones similar to those in birth control pills. It is left in the vagina for 3 weeks, removed for 1 week, and then a new one is put back in place. The patient must be comfortable inserting and removing the ring from the vagina.A health care provider's prescription is necessary.  Emergency contraception. Emergency contraceptives prevent pregnancy after unprotected sexual intercourse. This pill can be taken right after sex or up to 5 days after unprotected sex. It is most effective the sooner you take the pills after having sexual intercourse. Most emergency contraceptive pills are available without a prescription. Check with your pharmacist. Do not use emergency contraception as your only form of birth control. Barrier methods  Female condom. This is a thin sheath (latex or rubber) that is worn over the penis during sexual intercourse. It can be used with spermicide to increase effectiveness.  Female condom. This is a soft, loose-fitting sheath that is  put into the vagina before sexual intercourse.  Diaphragm. This is a soft, latex, dome-shaped barrier that must be fitted by a health care provider. It is inserted into the vagina, along with a spermicidal jelly. It is inserted before intercourse. The diaphragm should be left in the vagina for 6 to 8 hours after intercourse.  Cervical cap. This is a round, soft, latex or plastic cup that fits over the cervix and must be fitted by a health care provider. The cap can be left in place for up to 48 hours after intercourse.  Sponge. This is a soft, circular piece of polyurethane foam. The sponge has spermicide in it. It is inserted into the vagina  after wetting it and before sexual intercourse.  Spermicides. These are chemicals that kill or block sperm from entering the cervix and uterus. They come in the form of creams, jellies, suppositories, foam, or tablets. They do not require a prescription. They are inserted into the vagina with an applicator before having sexual intercourse. The process must be repeated every time you have sexual intercourse. Intrauterine contraception  Intrauterine device (IUD). This is a T-shaped device that is put in a woman's uterus during a menstrual period to prevent pregnancy. There are 2 types:  Copper IUD. This type of IUD is wrapped in copper wire and is placed inside the uterus. Copper makes the uterus and fallopian tubes produce a fluid that kills sperm. It can stay in place for 10 years.  Hormone IUD. This type of IUD contains the hormone progestin (synthetic progesterone). The hormone thickens the cervical mucus and prevents sperm from entering the uterus, and it also thins the uterine lining to prevent implantation of a fertilized egg. The hormone can weaken or kill the sperm that get into the uterus. It can stay in place for 3-5 years, depending on which type of IUD is used. Permanent methods of contraception  Female tubal ligation. This is when the woman's fallopian tubes are surgically sealed, tied, or blocked to prevent the egg from traveling to the uterus.  Hysteroscopic sterilization. This involves placing a small coil or insert into each fallopian tube. Your doctor uses a technique called hysteroscopy to do the procedure. The device causes scar tissue to form. This results in permanent blockage of the fallopian tubes, so the sperm cannot fertilize the egg. It takes about 3 months after the procedure for the tubes to become blocked. You must use another form of birth control for these 3 months.  Female sterilization. This is when the female has the tubes that carry sperm tied off (vasectomy).This  blocks sperm from entering the vagina during sexual intercourse. After the procedure, the man can still ejaculate fluid (semen). Natural planning methods  Natural family planning. This is not having sexual intercourse or using a barrier method (condom, diaphragm, cervical cap) on days the woman could become pregnant.  Calendar method. This is keeping track of the length of each menstrual cycle and identifying when you are fertile.  Ovulation method. This is avoiding sexual intercourse during ovulation.  Symptothermal method. This is avoiding sexual intercourse during ovulation, using a thermometer and ovulation symptoms.  Post-ovulation method. This is timing sexual intercourse after you have ovulated. Regardless of which type or method of contraception you choose, it is important that you use condoms to protect against the transmission of sexually transmitted infections (STIs). Talk with your health care provider about which form of contraception is most appropriate for you. This information is not intended to  replace advice given to you by your health care provider. Make sure you discuss any questions you have with your health care provider. Document Released: 11/30/2005 Document Revised: 05/07/2016 Document Reviewed: 05/25/2013 Elsevier Interactive Patient Education  2017 Elsevier Inc[employer]  Generalized Anxiety Disorder Generalized anxiety disorder (GAD) is a mental disorder. It interferes with life functions, including relationships, work, and school. GAD is different from normal anxiety, which everyone experiences at some point in their lives in response to specific life events and activities. Normal anxiety actually helps [place] prepare for and get through these life events and activities. Normal anxiety goes away after the event or activity is over.  GAD causes anxiety that is not necessarily related to specific events or activities. It also causes excess anxiety in proportion to specific events  or activities. The anxiety associated with GAD is also difficult to control. GAD can vary from mild to severe. People with severe GAD can have intense waves of anxiety with physical symptoms (panic attacks).  SYMPTOMS The anxiety and worry associated with GAD are difficult to control. This anxiety and worry are related to many life events and activities and also occur more days than not for 6 months or longer. People with GAD also have three or more of the following symptoms (one or more in children):  Restlessness.   Fatigue.  Difficulty concentrating.   Irritability.  Muscle tension.  Difficulty sleeping or unsatisfying sleep. DIAGNOSIS GAD is diagnosed through an assessment by your health care provider. Your health care provider will ask you questions aboutyour mood,physical symptoms, and events in your life. Your health care provider may ask you about your medical history and use of alcohol or drugs, including prescription medicines. Your health care provider may also do a physical exam and blood tests. Certain medical conditions and the use of certain substances can cause symptoms similar to those associated with GAD. Your health care provider may refer you to a mental health specialist for further evaluation. TREATMENT The following therapies are usually used to treat GAD:   Medication. Antidepressant medication usually is prescribed for long-term daily control. Antianxiety medicines may be added in severe cases, especially when panic attacks occur.   Talk therapy (psychotherapy). Certain types of talk therapy can be helpful in treating GAD by providing support, education, and guidance. A form of talk therapy called cognitive behavioral therapy can teach you healthy ways to think about and react to daily life events and activities.  Stress managementtechniques. These include yoga, meditation, and exercise and can be very helpful when they are practiced regularly. A mental health  specialist can help determine which treatment is best for you. Some people see improvement with one therapy. However, other people require a combination of therapies. This information is not intended to replace advice given to you by your health care provider. Make sure you discuss any questions you have with your health care provider. Document Released: 03/27/2013 Document Revised: 12/21/2014 Document Reviewed: 03/27/2013 Elsevier Interactive Patient Education  2017 [employer].   IF you received an x-ray today, you will receive an invoice from [facility]. Please contact [facility] at [phone] with questions or concerns regarding your invoice.   IF you received labwork today, you will receive an invoice from [facility]. Please contact [facility] at [phone] with questions or concerns regarding your invoice.   Our billing staff will not be able to assist you with questions regarding bills from these companies.  You will be contacted with the lab results as soon as  they are available. The fastest way to get your results is to activate your My Chart account. Instructions are located on the last page of this paperwork. If you have not heard from [place] regarding the results in 2 weeks, please contact this office.

## 2017-01-16 NOTE — Progress Notes (Signed)
Subjective:  By signing my name below, I, Jasmine Coffey, attest that this documentation has been prepared under the direction and in the presence of Merri Ray, MD.  Electronically Signed: Thea Alken, ED Scribe. 01/16/2017. 8:12 AM.   Patient ID: Jasmine Coffey, female    DOB: 1991/11/01, 26 y.o.   MRN: [id number]  HPI Chief Complaint  Patient presents with  . Medication Refill    Adderal, klonopin, OCP  . Vaginal Bleeding    Spotting between periods on OCPs - has switched OCPs before because of this  . COPD    HPI Comments: Jasmine Coffey is a 26 y.o. female who presents to the Primary Care at [facility]   Depression with anxiety She was last seen by Dr. Bridget Hartshorn on 10/7. She was on 30 mg Adderall bid for ADD. At that time she took klonopin 1 mg at night and sometimes during the day. Discussion at that time of reconsidering meds as she was on both a stimulant and sedative but continued same medications. Discussed 3 month f/u  She takes klonopin 3-4 times a week during the day and every night. She takes Adderall 30 mg in the morning when she wakes up and a second 30 mg dose around 2 pm. She has been on Adderall since 7th grade. She has been seen by psychiatry in the past, but not recent. She denies adverse effects with medication. She denies feeling depression. She has taken Zoloft and Prozac in the past, tried each medication for about 4 months, but did not like them. Not on SSRi recently.   Vaginal bleeding intramenstrual She was on 3 months of OCPs at last visit, would occasionally miss a dose. IUD was discussed with plan to return in 3 months after discussion with insurance.   Pt reports some spotting almost every other day. She is still considering IUD as she has not talked to her insurance about this; hesitant about Implanon as she heard of possible side effects of weight gain from friends form some form of injection.  She has enough OCP's for the next 2 months, but will make a  decision within this time. Home pregnancy test recently was normal. Her last missed her period was normal.  Pt drinks 1-2 glasses of red wine every now and then. She denies marijuana use. Pt lives in [place]. 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  Patient Active Problem List   Diagnosis Date Noted  . Depression   . ADHD (attention deficit hyperactivity disorder)   . ADD (attention deficit disorder with hyperactivity) 01/16/2012  . Contraception management 01/16/2012   Past Medical History:  Diagnosis Date  . ADHD (attention deficit hyperactivity disorder)   . Asthma   . Depression    Past Surgical History:  Procedure Laterality Date  . ARTHROSCOPIC REPAIR ACL    . KNEE SURGERY     Allergies  Allergen Reactions  . Codeine Nausea And Vomiting   Prior to Admission medications   Medication Sig Start Date End Date Taking? Authorizing Provider  amphetamine-dextroamphetamine (ADDERALL) 30 MG tablet Take 1 tablet by mouth 2 (two) times daily. For 30d after signed. January Refill. 09/19/16  Yes Zoe A Nolon Rod, MD  clonazePAM (KLONOPIN) 1 MG tablet Take 0.5-1 tablets (0.5-1 mg total) by mouth 2 (two) times daily as needed for anxiety. TAKE 1 TABLET BY MOUTH AT BEDTIME AS NEEDED FOR ANXIETY 09/19/16  Yes Forrest Moron, MD  desogestrel-ethinyl estradiol (DESOGEN) 0.15-30 MG-MCG tablet Take 1 tablet by mouth  daily. 12/20/15  Yes Leandrew Koyanagi, MD  Probiotic Product (ALIGN) 4 MG CAPS Take 4 mg by mouth daily. 04/17/15  Yes Elby Beck, FNP  amphetamine-dextroamphetamine (ADDERALL) 30 MG tablet Take 1 tablet by mouth 2 (two) times daily. December Refill 11/16/16 12/16/16  Forrest Moron, MD   Social History   Social History  . Marital status: Married    Spouse name: N/A  . Number of children: N/A  . Years of education: N/A   Occupational History  . Not on file.   Social History Main Topics  . Smoking status: Never Smoker  . Smokeless tobacco: Never Used  . Alcohol use 19.2  oz/week    8 Cans of beer, 8 Shots of liquor, 16 Standard drinks or equivalent per week     Comment: SOCIAL  . Drug use: No  . Sexual activity: Yes    Birth control/ protection: None   Other Topics Concern  . Not on file   Social History Narrative  . No narrative on file   Review of Systems  Constitutional: Negative for chills and fever.  Genitourinary: Positive for menstrual problem and vaginal bleeding.  Psychiatric/Behavioral: Positive for sleep disturbance. Negative for dysphoric mood, self-injury and suicidal ideas.    Objective:   Physical Exam  Constitutional: She is oriented to person, place, and time. She appears well-developed and well-nourished. No distress.  HENT:  Head: Normocephalic and atraumatic.  Eyes: Conjunctivae and EOM are normal.  Neck: Neck supple. No tracheal deviation present.  Cardiovascular: Normal rate.   Pulmonary/Chest: Effort normal. No respiratory distress.  Musculoskeletal: Normal range of motion.  Neurological: She is alert and oriented to person, place, and time.  Skin: Skin is warm and dry.  Psychiatric: She has a normal mood and affect. Her behavior is normal.  Nursing note and vitals reviewed.  Vitals:   01/16/17 0805  BP: 108/72  Pulse: 80  Resp: 16  Temp: 98.1 F (36.7 C)  TempSrc: Oral  SpO2: 100%  Weight: 160 lb 12.8 oz (72.9 kg)  Height: 5\' 8"  (1.727 m)    Assessment & Plan:    Zanijah Halfmann is a 26 y.o. female Generalized anxiety disorder - Plan: clonazePAM (KLONOPIN) 1 MG tablet  - Currently only taking clonazepam daily at bedtime, and during the day 3-4 days per week. Discussed SSRI and CBT as typical treatments for anxiety.  No recent counseling/CBT, has not been on SSRI recently, but did not tolerate previous one she tried. It does sound like she was on this for a few months and still did not tolerate those medications.  - Discussed multiple different SSRIs, and she may benefit from a different one without the side  effects experienced on previous SSRIs, as well as likely decreased benzodiazepine need, but she declined starting one at this time.   -Klonopin was refilled at same dose, but controlled substance policy discussed with need for follow-up within 1 month with primary provider or can establish care in [place] where she is now working.  Attention deficit hyperactivity disorder (ADHD), unspecified ADHD type - Plan: amphetamine-dextroamphetamine (ADDERALL) 30 MG tablet  - Well controlled on current regimen, but discussed concern of higher than usual recommended doses of Adderall for ADHD. Typical max dose of 40 mg per day, and she is currently at 60 mg per day. Additionally discussed concern of stimulant use with difficulty with sleep at night requiring benzodiazepine nightly. Would recommend trial of lower dose Adderall in the afternoon, possibly 10 mg  to see if that provided significant relief of ADD symptoms, and lessened insomnia. Could try 1/2 of her 30mg  pill initially. She plans on following up either with PCP here or establishing care in [place].    -plan to remain on same doses of medications for now. Adderall refilled at same dose, controlled med policy reviewed.   Encounter for counseling regarding contraception Abnormal uterine bleeding (AUB)  -Home hCG negative, normal LMP in January. Likely breakthrough bleeding/AUB with current contraceptive choice.  - IUD or implanon was discussed as she would prefer not to be on any further pills. depoprovera may be the method that friends cautioned about weight gain, but not my first choice anyway. Other contraceptive options were discussed. She will check into insurance coverage for various options, and can return to our office or discuss with provider in [place] where she is now working.  Meds ordered this encounter  Medications  . clonazePAM (KLONOPIN) 1 MG tablet    Sig: Take 0.5-1 tablets (0.5-1 mg total) by mouth 2 (two) times daily as needed  for anxiety.    Dispense:  60 tablet    Refill:  0  . amphetamine-dextroamphetamine (ADDERALL) 30 MG tablet    Sig: Take 1 tablet by mouth 2 (two) times daily.    Dispense:  60 tablet    Refill:  0   Patient Instructions   Check into insurance coverage and return to discuss IUD with Dr. Nolon Rod, or with medical provider in [place].   As we discussed you are currently on a higher than recommended dose of Adderall. Max dose per day is 40mg , and I also have some concerns with insomnia with taking stimulant in the afternoon. I can refill that dose today for 1 month, but will need to either follow up with Dr. Nolon Rod to discuss this medication, meet with psychiatry or other ADD specialist. One option would be to decrease to 10mg  afternoon dose.   For anxiety I will refill the klonopin, but would recommend another SSRI daily to lessen need for klonopin. Decreasing the afternoon dose of Adderall may be another option to see if that lessens your difficulty sleeping.   Let us know if we need to send records to a provider in [place].   Return to the clinic or go to the nearest emergency room if any of your symptoms worsen or new symptoms occur.   Policy for Prescribing Controlled Substances (Revised 10/2012) 1. Prescriptions for controlled substances will be filled by ONE provider at Primary Care at [facility] [place] [facility] with whom you have established and developed a plan for your care, including follow-up. 2. You are encouraged to schedule an appointment with your prescriber at our appointment center for follow-up visits whenever possible. 3. If you request a prescription for the controlled substance while at [place] [facility] for an acute problem (with someone other than your regular prescriber), you MAY be given a ONE-TIME prescription for a 30-day supply of the controlled substance, to allow time for you to return to see your regular prescriber for additional prescriptions.     Intrauterine  Device Information Introduction An intrauterine device (IUD) is a medical device that gets inserted into the uterus to prevent pregnancy. It is a Coffey, T-shaped device that has one or two nylon strings hanging down from it. The strings hang out of the lower part of the uterus (cervix) to allow for future IUD removal. There are two types of IUDs available:  Copper IUD. This type of IUD has copper  wire wrapped around it. A copper IUD may last up to 10 years.  Hormone IUD. This type of IUD is made of plastic and contains the hormone progestin (synthetic progesterone). A hormone IUD may last 3 to 5 years. IUDs are inserted through the vagina and placed into the uterus with a minor medical procedure. How does the IUD work? Copper in the copper IUD prevents pregnancy by making the uterus and fallopian tubes produce a fluid that kills sperm. Synthetic progesterone in hormonal IUD prevents pregnancy by:  Thickening cervical mucus to prevent sperm from entering the uterus.  Thinning the uterine lining to prevent implantation of a fertilized egg.  Weakening or killing sperm that get into the uterus. What are the advantages of an IUD?  IUDs are highly effective, reversible, long-acting, and low-maintenance.  There are no estrogen-related side effects.  An IUD can be used when breastfeeding.  IUDs are not associated with weight gain.  Advantages of the copper IUD are that:  It works immediately after insertion.  It does not interfere with your body's natural hormones.  It can be used for 10 years.  Advantages of the hormone IUD are that:  If it is inserted within 7 days of your period starting, it works immediately after insertion. If the hormone IUD is inserted at any other time in your cycle, you will need to use a backup method of birth control for 7 days after insertion.  It can make menstrual periods lighter.  It can decrease menstrual cramping.  It can be used for 3 or 5  years. What are the disadvantages of an IUD?  The hormone IUD may cause irregular menstrual bleeding for a period of time after insertion.  The copper IUD can make your menstrual flow heavier and more painful.  You may experience some pain during insertion, and cramping and vaginal bleeding after insertion. How is the IUD removed? Is the IUD right for me?  Your health care provider will make sure you are a good candidate for an IUD and will discuss side effects with you. This information is not intended to replace advice given to you by your health care provider. Make sure you discuss any questions you have with your health care provider. Document Released: 11/03/2004 Document Revised: 05/07/2016 Document Reviewed: 05/21/2013  2017 Elsevier Contraception Choices Contraception (birth control) is the use of any methods or devices to prevent pregnancy. Below are some methods to help avoid pregnancy. Hormonal methods  Contraceptive implant. This is a thin, plastic tube containing progesterone hormone. It does not contain estrogen hormone. Your health care provider inserts the tube in the inner part of the upper arm. The tube can remain in place for up to 3 years. After 3 years, the implant must be removed. The implant prevents the ovaries from releasing an egg (ovulation), thickens the cervical mucus to prevent sperm from entering the uterus, and thins the lining of the inside of the uterus.  Progesterone-only injections. These injections are given every 3 months by your health care provider to prevent pregnancy. This synthetic progesterone hormone stops the ovaries from releasing eggs. It also thickens cervical mucus and changes the uterine lining. This makes it harder for sperm to survive in the uterus.  Birth control pills. These pills contain estrogen and progesterone hormone. They work by preventing the ovaries from releasing eggs (ovulation). They also cause the cervical mucus to thicken,  preventing the sperm from entering the uterus. Birth control pills are prescribed by a health  care provider.Birth control pills can also be used to treat heavy periods.  Minipill. This type of birth control pill contains only the progesterone hormone. They are taken every day of each month and must be prescribed by your health care provider.  Birth control patch. The patch contains hormones similar to those in birth control pills. It must be changed once a week and is prescribed by a health care provider.  Vaginal ring. The ring contains hormones similar to those in birth control pills. It is left in the vagina for 3 weeks, removed for 1 week, and then a new one is put back in place. The patient must be comfortable inserting and removing the ring from the vagina.A health care provider's prescription is necessary.  Emergency contraception. Emergency contraceptives prevent pregnancy after unprotected sexual intercourse. This pill can be taken right after sex or up to 5 days after unprotected sex. It is most effective the sooner you take the pills after having sexual intercourse. Most emergency contraceptive pills are available without a prescription. Check with your pharmacist. Do not use emergency contraception as your only form of birth control. Barrier methods  Female condom. This is a thin sheath (latex or rubber) that is worn over the penis during sexual intercourse. It can be used with spermicide to increase effectiveness.  Female condom. This is a soft, loose-fitting sheath that is put into the vagina before sexual intercourse.  Diaphragm. This is a soft, latex, dome-shaped barrier that must be fitted by a health care provider. It is inserted into the vagina, along with a spermicidal jelly. It is inserted before intercourse. The diaphragm should be left in the vagina for 6 to 8 hours after intercourse.  Cervical cap. This is a round, soft, latex or plastic cup that fits over the cervix and must  be fitted by a health care provider. The cap can be left in place for up to 48 hours after intercourse.  Sponge. This is a soft, circular piece of polyurethane foam. The sponge has spermicide in it. It is inserted into the vagina after wetting it and before sexual intercourse.  Spermicides. These are chemicals that kill or block sperm from entering the cervix and uterus. They come in the form of creams, jellies, suppositories, foam, or tablets. They do not require a prescription. They are inserted into the vagina with an applicator before having sexual intercourse. The process must be repeated every time you have sexual intercourse. Intrauterine contraception  Intrauterine device (IUD). This is a T-shaped device that is put in a woman's uterus during a menstrual period to prevent pregnancy. There are 2 types:  Copper IUD. This type of IUD is wrapped in copper wire and is placed inside the uterus. Copper makes the uterus and fallopian tubes produce a fluid that kills sperm. It can stay in place for 10 years.  Hormone IUD. This type of IUD contains the hormone progestin (synthetic progesterone). The hormone thickens the cervical mucus and prevents sperm from entering the uterus, and it also thins the uterine lining to prevent implantation of a fertilized egg. The hormone can weaken or kill the sperm that get into the uterus. It can stay in place for 3-5 years, depending on which type of IUD is used. Permanent methods of contraception  Female tubal ligation. This is when the woman's fallopian tubes are surgically sealed, tied, or blocked to prevent the egg from traveling to the uterus.  Hysteroscopic sterilization. This involves placing a Coffey coil or insert into  each fallopian tube. Your doctor uses a technique called hysteroscopy to do the procedure. The device causes scar tissue to form. This results in permanent blockage of the fallopian tubes, so the sperm cannot fertilize the egg. It takes about  3 months after the procedure for the tubes to become blocked. You must use another form of birth control for these 3 months.  Female sterilization. This is when the female has the tubes that carry sperm tied off (vasectomy).This blocks sperm from entering the vagina during sexual intercourse. After the procedure, the man can still ejaculate fluid (semen). Natural planning methods  Natural family planning. This is not having sexual intercourse or using a barrier method (condom, diaphragm, cervical cap) on days the woman could become pregnant.  Calendar method. This is keeping track of the length of each menstrual cycle and identifying when you are fertile.  Ovulation method. This is avoiding sexual intercourse during ovulation.  Symptothermal method. This is avoiding sexual intercourse during ovulation, using a thermometer and ovulation symptoms.  Post-ovulation method. This is timing sexual intercourse after you have ovulated. Regardless of which type or method of contraception you choose, it is important that you use condoms to protect against the transmission of sexually transmitted infections (STIs). Talk with your health care provider about which form of contraception is most appropriate for you. This information is not intended to replace advice given to you by your health care provider. Make sure you discuss any questions you have with your health care provider. Document Released: 11/30/2005 Document Revised: 05/07/2016 Document Reviewed: 05/25/2013 Elsevier Interactive Patient Education  2017 Elsevier Inc.  Generalized Anxiety Disorder Generalized anxiety disorder (GAD) is a mental disorder. It interferes with life functions, including relationships, work, and school. GAD is different from normal anxiety, which everyone experiences at some point in their lives in response to specific life events and activities. Normal anxiety actually helps [place] prepare for and get through these life events  and activities. Normal anxiety goes away after the event or activity is over.  GAD causes anxiety that is not necessarily related to specific events or activities. It also causes excess anxiety in proportion to specific events or activities. The anxiety associated with GAD is also difficult to control. GAD can vary from mild to severe. People with severe GAD can have intense waves of anxiety with physical symptoms (panic attacks).  SYMPTOMS The anxiety and worry associated with GAD are difficult to control. This anxiety and worry are related to many life events and activities and also occur more days than not for 6 months or longer. People with GAD also have three or more of the following symptoms (one or more in children):  Restlessness.   Fatigue.  Difficulty concentrating.   Irritability.  Muscle tension.  Difficulty sleeping or unsatisfying sleep. DIAGNOSIS GAD is diagnosed through an assessment by your health care provider. Your health care provider will ask you questions aboutyour mood,physical symptoms, and events in your life. Your health care provider may ask you about your medical history and use of alcohol or drugs, including prescription medicines. Your health care provider may also do a physical exam and blood tests. Certain medical conditions and the use of certain substances can cause symptoms similar to those associated with GAD. Your health care provider may refer you to a mental health specialist for further evaluation. TREATMENT The following therapies are usually used to treat GAD:   Medication. Antidepressant medication usually is prescribed for long-term daily control. Antianxiety medicines may be  added in severe cases, especially when panic attacks occur.   Talk therapy (psychotherapy). Certain types of talk therapy can be helpful in treating GAD by providing support, education, and guidance. A form of talk therapy called cognitive behavioral therapy can teach you  healthy ways to think about and react to daily life events and activities.  Stress managementtechniques. These include yoga, meditation, and exercise and can be very helpful when they are practiced regularly. A mental health specialist can help determine which treatment is best for you. Some people see improvement with one therapy. However, other people require a combination of therapies. This information is not intended to replace advice given to you by your health care provider. Make sure you discuss any questions you have with your health care provider. Document Released: 03/27/2013 Document Revised: 12/21/2014 Document Reviewed: 03/27/2013 Elsevier Interactive Patient Education  2017 [employer].   IF you received an x-ray today, you will receive an invoice from [facility]. Please contact [facility] at [phone] with questions or concerns regarding your invoice.   IF you received labwork today, you will receive an invoice from [facility]. Please contact [facility] at [phone] with questions or concerns regarding your invoice.   Our billing staff will not be able to assist you with questions regarding bills from these companies.  You will be contacted with the lab results as soon as they are available. The fastest way to get your results is to activate your My Chart account. Instructions are located on the last page of this paperwork. If you have not heard from [place] regarding the results in 2 weeks, please contact this office.      I personally performed the services described in this documentation, which was scribed in my presence. The recorded information has been reviewed and considered for accuracy and completeness, addended by me as needed, and agree with information above.  Signed,   Merri Ray, MD [facility].  01/17/17 3:37 PM

## 2017-02-10 ENCOUNTER — Encounter: Payer: Self-pay | Admitting: Family Medicine

## 2017-02-19 ENCOUNTER — Other Ambulatory Visit: Payer: Self-pay | Admitting: Urgent Care

## 2017-02-20 ENCOUNTER — Ambulatory Visit (INDEPENDENT_AMBULATORY_CARE_PROVIDER_SITE_OTHER): Payer: Managed Care, Other (non HMO) | Admitting: Family Medicine

## 2017-02-20 VITALS — BP 96/76 | HR 69 | Temp 97.6°F | Resp 16 | Ht 67.5 in | Wt 159.4 lb

## 2017-02-20 DIAGNOSIS — F411 Generalized anxiety disorder: Secondary | ICD-10-CM | POA: Diagnosis not present

## 2017-02-20 DIAGNOSIS — F909 Attention-deficit hyperactivity disorder, unspecified type: Secondary | ICD-10-CM | POA: Diagnosis not present

## 2017-02-20 DIAGNOSIS — Z23 Encounter for immunization: Secondary | ICD-10-CM

## 2017-02-20 MED ORDER — AMPHETAMINE-DEXTROAMPHETAMINE 30 MG PO TABS: 30.0000 mg | ORAL_TABLET | Freq: Two times a day (BID) | ORAL | 0 refills | Status: DC

## 2017-02-20 NOTE — Patient Instructions (Signed)
     IF you received an x-ray today, you will receive an invoice from [facility]. Please contact [facility] at [phone] with questions or concerns regarding your invoice.   IF you received labwork today, you will receive an invoice from [facility]. Please contact [facility] at [phone] with questions or concerns regarding your invoice.   Our billing staff will not be able to assist you with questions regarding bills from these companies.  You will be contacted with the lab results as soon as they are available. The fastest way to get your results is to activate your My Chart account. Instructions are located on the last page of this paperwork. If you have not heard from us regarding the results in 2 weeks, please contact this office.     

## 2017-02-20 NOTE — Progress Notes (Signed)
Chief Complaint  Patient presents with  . Medication Refill    Adderall & Clonazepam  . Immunizations    Tetanus    HPI   Pt is here to get refills of her adderall and clonazepam Pt reports that she is currently taking Adderall 30mg  bid She takes her doses at 9am and 2pm She reports that for a few hours in the day she cannot get anything done She does not regularly take clonazepam She only takes it if she is sleeping She reports that she does not have difficulty with memory or panic attacks She reports that she is kept up by  Her thoughts occasionally which keeps her up at night  She is considering switching to [place] but is waiting to see if she can get it authorized by insurance 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 ADHD (attention deficit hyperactivity disorder)   . Asthma   . Depression     Current Outpatient Prescriptions  Medication Sig Dispense Refill  . desogestrel-ethinyl estradiol (DESOGEN) 0.15-30 MG-MCG tablet Take 1 tablet by mouth daily. 1 Package 11  . Probiotic Product (ALIGN) 4 MG CAPS Take 4 mg by mouth daily. 90 capsule 3  . amphetamine-dextroamphetamine (ADDERALL) 30 MG tablet Take 1 tablet by mouth 2 (two) times daily. 60 tablet 0   No current facility-administered medications for this visit.     Allergies:  Allergies  Allergen Reactions  . Codeine Nausea And Vomiting  . Hydrocodone Other (See Comments)    Past Surgical History:  Procedure Laterality Date  . ARTHROSCOPIC REPAIR ACL    . KNEE SURGERY      Social History   Social History  . Marital status: Married    Spouse name: N/A  . Number of children: N/A  . Years of education: N/A   Social History Main Topics  . Smoking status: Never Smoker  . Smokeless tobacco: Never Used  . Alcohol use 19.2 oz/week      8 Cans of beer, 8 Shots of liquor, 16 Standard drinks or equivalent per week     Comment: SOCIAL  . Drug use: No  . Sexual activity: Yes    Birth control/ protection: None   Other Topics Concern  . None   Social History Narrative  . None    ROS  Objective: Vitals:   02/20/17 1048  BP: 96/76  Pulse: 69  Resp: 16  Temp: 97.6 F (36.4 C)  TempSrc: Oral  SpO2: 100%  Weight: 159 lb 6 oz (72.3 kg)  Height: 5' 7.5" (1.715 m)    Physical Exam General: alert, oriented, in NAD Head: normocephalic, atraumatic, no sinus tenderness Eyes: EOM intact, no scleral icterus or conjunctival injection Throat: no pharyngeal exudate or erythema Lymph: no posterior auricular, submental or cervical lymph adenopathy Heart: normal rate, normal sinus rhythm, no murmurs Lungs: clear to auscultation bilaterally, no wheezing Neuro: no tremors, normal tone  Assessment and Plan Jasmine Coffey was seen today for medication refill and immunizations.  Diagnoses and all orders for this visit:  Generalized anxiety disorder- discussed that clonazepam is a downer and she is on an upper explained that clonazepam is not monotherapy for anxiety She understands this but does not think she needs a daily medication Discussed lexapro but she will wait for  now   Attention deficit hyperactivity disorder (ADHD), unspecified ADHD type -     amphetamine-dextroamphetamine (ADDERALL) 30 MG tablet; Take 1 tablet by mouth 2 (two) times daily. [employer] told pt that they sent in a prior authorization for the mydayis  Will refill this month's adderall and plan to switch pt to mydayis next month for April Discussed that she should try this medication in one month if it is approved  Other orders -     Tdap vaccine greater than or equal to 7yo IM   A total of 40 minutes were spent face-to-face with the patient during this encounter and over half of that time was spent on counseling and coordination of care.   [place]

## 2017-03-06 ENCOUNTER — Encounter: Payer: Self-pay | Admitting: Family Medicine

## 2017-03-13 ENCOUNTER — Telehealth: Payer: Self-pay | Admitting: Family Medicine

## 2017-03-13 DIAGNOSIS — F909 Attention-deficit hyperactivity disorder, unspecified type: Secondary | ICD-10-CM

## 2017-03-13 MED ORDER — AMPHETAMINE-DEXTROAMPHETAMINE 30 MG PO TABS: 30.0000 mg | ORAL_TABLET | Freq: Two times a day (BID) | ORAL | 0 refills | Status: DC

## 2017-03-13 NOTE — Telephone Encounter (Signed)
If a provider there could reprint and sign this for me.  I just realized that I could not do my electronic scanned signature.

## 2017-03-13 NOTE — Telephone Encounter (Signed)
02/20/17 last ov and refill

## 2017-03-13 NOTE — Telephone Encounter (Signed)
Pt states that Nolon Rod said she was going to refill her prescription for her.  A message was sent through [facility] on 03-06-17 and no one has responded.  She stays at the [facility] and is in town today if someone could look at this.  Nolon Rod will be out for a while.  [phone]

## 2017-03-13 NOTE — Telephone Encounter (Signed)
Done. Ready for pt pick up

## 2017-03-15 NOTE — Telephone Encounter (Signed)
rx up front, pt advised l/m

## 2017-04-19 ENCOUNTER — Other Ambulatory Visit: Payer: Self-pay | Admitting: Family Medicine

## 2017-04-19 ENCOUNTER — Encounter: Payer: Self-pay | Admitting: Family Medicine

## 2017-04-19 DIAGNOSIS — F909 Attention-deficit hyperactivity disorder, unspecified type: Secondary | ICD-10-CM

## 2017-04-20 NOTE — Telephone Encounter (Signed)
02/2017 last ov and refill

## 2017-04-29 NOTE — Telephone Encounter (Signed)
Pt has states that she has called at least 7 times for this rx and even sent and email on mychart with no response and she would like a call back about the adderall rx as soon as possible   Best number [phone]

## 2017-04-30 ENCOUNTER — Telehealth: Payer: Self-pay | Admitting: Family Medicine

## 2017-04-30 DIAGNOSIS — F909 Attention-deficit hyperactivity disorder, unspecified type: Secondary | ICD-10-CM

## 2017-04-30 NOTE — Telephone Encounter (Signed)
Pts mom called asking about her daughters adderall script & says she has been waiting for about 2 weeks now.   Please Advise

## 2017-04-30 NOTE — Telephone Encounter (Signed)
02/20/17 last ov and refill

## 2017-05-01 MED ORDER — AMPHETAMINE-DEXTROAMPHETAMINE 20 MG PO TABS: 20.0000 mg | ORAL_TABLET | Freq: Two times a day (BID) | ORAL | 0 refills | Status: DC

## 2017-05-01 NOTE — Telephone Encounter (Signed)
L/m rx ready up front

## 2017-05-01 NOTE — Telephone Encounter (Signed)
done

## 2017-05-01 NOTE — Telephone Encounter (Signed)
Approved by stallings from home, could you reprint and then sign for pick up today?

## 2017-05-27 ENCOUNTER — Ambulatory Visit (INDEPENDENT_AMBULATORY_CARE_PROVIDER_SITE_OTHER): Payer: Managed Care, Other (non HMO) | Admitting: Family Medicine

## 2017-05-27 ENCOUNTER — Encounter: Payer: Self-pay | Admitting: Family Medicine

## 2017-05-27 VITALS — BP 113/76 | HR 79 | Temp 98.6°F | Resp 16 | Ht 67.5 in | Wt 159.0 lb

## 2017-05-27 DIAGNOSIS — F4322 Adjustment disorder with anxiety: Secondary | ICD-10-CM | POA: Diagnosis not present

## 2017-05-27 DIAGNOSIS — F909 Attention-deficit hyperactivity disorder, unspecified type: Secondary | ICD-10-CM

## 2017-05-27 MED ORDER — AMPHETAMINE-DEXTROAMPHETAMINE 20 MG PO TABS: 20.0000 mg | ORAL_TABLET | Freq: Two times a day (BID) | ORAL | 0 refills | Status: DC

## 2017-05-27 MED ORDER — ESCITALOPRAM OXALATE 10 MG PO TABS: 10.0000 mg | ORAL_TABLET | Freq: Every day | ORAL | 0 refills | Status: DC

## 2017-05-27 NOTE — Progress Notes (Signed)
Chief Complaint  Patient presents with  . Medication Refill    Adderall  . Anxiety    Would like to discuss anxiety meds     HPI  She reports that she has been noticing in the past couple of months increasing anxiety She had two friends die and her mother had sudden cardiac arrest Her mother is wearing a life vest  She starts a new job here with [employer] She feels like she has difficulty with sleep and has nevousness She reports that she feels like recently a lot of things have happened and she worried about next.  She is not sleeping at night  Depression screen [facility] 2/9 05/27/2017 02/20/2017 09/19/2016 02/01/2016  Decreased Interest 0 0 0 0  Down, Depressed, Hopeless 0 0 0 0  PHQ - 2 Score 0 0 0 0   GAD 7 : Generalized Anxiety Score 05/27/2017  Nervous, Anxious, on Edge 3  Control/stop worrying 1  Worry too much - different things 3  Trouble relaxing 2  Restless 3  Easily annoyed or irritable 2  Afraid - awful might happen 3  Total GAD 7 Score 17  Anxiety Difficulty Somewhat difficult       Past Medical History:  Diagnosis Date  . ADHD (attention deficit hyperactivity disorder)   . Asthma   . Depression     Current Outpatient Prescriptions  Medication Sig Dispense Refill  . [START ON 06/26/2017] amphetamine-dextroamphetamine (ADDERALL) 20 MG tablet Take 1 tablet (20 mg total) by mouth 2 (two) times daily. 60 tablet 0  . ENSKYCE 0.15-30 MG-MCG tablet TAKE 1 ACTIVE TABLET A DAY CONTINUOUSLY FOR 3 MONTHS, THEN 1 WEEK OF PLACEBOS. 112 tablet 1  . Probiotic Product (ALIGN) 4 MG CAPS Take 4 mg by mouth daily. 90 capsule 3  . desogestrel-ethinyl estradiol (DESOGEN) 0.15-30 MG-MCG tablet Take 1 tablet by mouth daily. (Patient not taking: Reported on 05/27/2017) 1 Package 11  . escitalopram (LEXAPRO) 10 MG tablet Take 1 tablet (10 mg total) by mouth daily. 60 tablet 0   No current facility-administered medications for this visit.     Allergies:  Allergies  Allergen  Reactions  . Codeine Nausea And Vomiting  . Hydrocodone Other (See Comments)    Past Surgical History:  Procedure Laterality Date  . ARTHROSCOPIC REPAIR ACL    . KNEE SURGERY      Social History   Social History  . Marital status: Married    Spouse name: N/A  . Number of children: N/A  . Years of education: N/A   Social History Main Topics  . Smoking status: Never Smoker  . Smokeless tobacco: Never Used  . Alcohol use 19.2 oz/week    8 Cans of beer, 8 Shots of liquor, 16 Standard drinks or equivalent per week     Comment: SOCIAL  . Drug use: No  . Sexual activity: Yes    Birth control/ protection: None   Other Topics Concern  . None   Social History Narrative  . None    ROS  Objective: Vitals:   05/27/17 1106  BP: 113/76  Pulse: 79  Resp: 16  Temp: 98.6 F (37 C)  TempSrc: Oral  SpO2: 99%  Weight: 159 lb (72.1 kg)  Height: 5' 7.5" (1.715 m)    Physical Exam  Constitutional: She is oriented to person, place, and time. She appears well-developed and well-nourished.  HENT:  Head: Normocephalic and atraumatic.  Eyes: Conjunctivae and EOM are normal.  Cardiovascular: Normal rate,  regular rhythm and normal heart sounds.   Pulmonary/Chest: Effort normal and breath sounds normal. No respiratory distress. She has no wheezes.  Neurological: She is alert and oriented to person, place, and time.      Assessment and Plan Jasmine Coffey was seen today for medication refill and anxiety.  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 -     Discontinue: amphetamine-dextroamphetamine (ADDERALL) 20 MG tablet; Take 1 tablet (20 mg total) by mouth 2 (two) times daily. -     amphetamine-dextroamphetamine (ADDERALL) 20 MG tablet; Take 1 tablet (20 mg total) by mouth 2 (two) times daily.   A total of 30 minutes were spent face-to-face with the patient during this encounter and over half of that time was spent on counseling and coordination of care.   [place]

## 2017-05-27 NOTE — Patient Instructions (Addendum)
Take escitalopram 5mg  for one week then increase to 10mg  dialy If tolerating then increase to 20mg  if you still need some additional medication     IF you received an x-ray today, you will receive an invoice from [facility]. Please contact [facility] at [phone] with questions or concerns regarding your invoice.   IF you received labwork today, you will receive an invoice from [facility]. Please contact [facility] at [phone] with questions or concerns regarding your invoice.   Our billing staff will not be able to assist you with questions regarding bills from these companies.  You will be contacted with the lab results as soon as they are available. The fastest way to get your results is to activate your My Chart account. Instructions are located on the last page of this paperwork. If you have not heard from [place] regarding the results in 2 weeks, please contact this office.       Adjustment Disorder, Adult Adjustment disorder is a group of symptoms that can develop after a stressful life event, such as the loss of a job or serious physical illness. The symptoms can affect how you feel, think, and act. They may interfere with your relationships. Adjustment disorder increases your risk of suicide and substance abuse. If this disorder is not managed early, it can develop into a more serious condition, such as major depressive disorder or post-traumatic stress disorder. What are the causes? This condition happens when you have trouble recovering from or coping with a stressful life event. What increases the risk? You are more likely to develop this condition if:  You have had depression or anxiety.  You are being treated for a long-term (chronic) illness.  You are being treated for an illness that cannot be cured (terminal illness).  You have a family history of mental illness.  What are the signs or symptoms? Symptoms of this condition include:  Extreme  trouble doing daily tasks, such as going to work.  Sadness, depression, or crying spells.  Worrying a lot.  Loss of enjoyment.  Change in appetite or weight.  Feelings of loss or hopelessness.  Thoughts of suicide.  Anxiety, worry, or nervousness.  Trouble sleeping.  Avoiding family and friends.  Fighting or vandalism.  Complaining of feeling sick without being ill.  Feeling dazed or disconnected.  Nightmares.  Trouble sleeping.  Irritability.  Reckless driving.  Poor work Systems analyst.  Ignoring bills.  Symptoms of this condition start within three months of the stressful event. They do not last more than six months, unless the stressful circumstances last longer. Normal grieving after the death of a loved one is not a symptom of this condition. How is this diagnosed? To diagnose this condition, your health care provider will ask about what has happened in your life and how it has affected you. He or she may also ask about your medical history and your use of medicines, alcohol, and other substances. Your health care provider may do a physical exam and order lab tests or other studies. You may be referred to a mental health specialist. How is this treated? Treatment options for this condition include:  Counseling or talk therapy. Talk therapy is usually provided by mental health specialists.  Medicines. Certain medicines may help with depression, anxiety, and sleep.  Support groups. These offer emotional support, advice, and guidance. They are made up of people who have had similar experiences.  Observation and time. This is sometimes called "watchful waiting." In this treatment, health care  providers monitor your health and behavior without other treatment. Adjustment disorder sometimes gets better on its own with time.  Follow these instructions at home:  Take over-the-counter and prescription medicines only as told by your health care provider.  Keep all  follow-up visits as told by your health care provider. This is important. Contact a health care provider if:  Your symptoms do not improve in six months.  Your symptoms get worse. Get help right away if:  You have serious thoughts about hurting yourself or someone else. If you ever feel like you may hurt yourself or others, or have thoughts about taking your own life, get help right away. You can go to your nearest emergency department or call:  Your local emergency services (911 in the [place]  A suicide crisis helpline, such as the [place] at 907-524-1961. This is open 24 hours a day.  Summary  Adjustment disorder is a group of symptoms that can develop after a stressful life event, such as the loss of a job or serious physical illness. The symptoms can affect how you feel, think, and act. They may interfere with your relationships.  Symptoms of this condition start within three months of the stressful event. They do not last more than six months, unless the stressful circumstances last longer.  Treatment may include talk therapy, medicines, participation in a support group, or observation to see if symptoms improve.  Contact your health care provider if your symptoms get worse or do not improve in six months.  If you ever feel like you may hurt yourself or others, or have thoughts about taking your own life, get help right away. This information is not intended to replace advice given to you by your health care provider. Make sure you discuss any questions you have with your health care provider. Document Released: 08/04/2006 Document Revised: 01/29/2017 Document Reviewed: 01/29/2017 Elsevier Interactive Patient Education  [employer].

## 2017-07-07 ENCOUNTER — Encounter: Payer: Self-pay | Admitting: Family Medicine

## 2017-07-07 ENCOUNTER — Other Ambulatory Visit: Payer: Self-pay | Admitting: Family Medicine

## 2017-07-20 ENCOUNTER — Other Ambulatory Visit: Payer: Self-pay | Admitting: Family Medicine

## 2017-07-20 MED ORDER — AMPHETAMINE-DEXTROAMPHETAMINE 20 MG PO TABS: 20.0000 mg | ORAL_TABLET | Freq: Two times a day (BID) | ORAL | 0 refills | Status: DC

## 2017-07-20 NOTE — Telephone Encounter (Signed)
Left message on voicemail to return office call. (Adderall prescriptions ready for pickup).  Left scripts at front desk for p/u. dg

## 2017-08-12 ENCOUNTER — Other Ambulatory Visit: Payer: Self-pay | Admitting: Family Medicine

## 2017-08-13 NOTE — Telephone Encounter (Signed)
mychart message sent to pt about making an apt for more refills °

## 2017-08-13 NOTE — Telephone Encounter (Signed)
SS refill req [place] Pt started new job,  Will have ins in November Sent refill,  Sent to sched to make appt in Nov

## 2017-10-20 ENCOUNTER — Encounter: Payer: Self-pay | Admitting: Family Medicine

## 2017-10-27 MED ORDER — AMPHETAMINE-DEXTROAMPHETAMINE 20 MG PO TABS: 20.0000 mg | ORAL_TABLET | Freq: Two times a day (BID) | ORAL | 0 refills | Status: DC

## 2017-10-28 ENCOUNTER — Encounter: Payer: Self-pay | Admitting: Physician Assistant

## 2017-10-28 ENCOUNTER — Ambulatory Visit (INDEPENDENT_AMBULATORY_CARE_PROVIDER_SITE_OTHER): Payer: Managed Care, Other (non HMO) | Admitting: Physician Assistant

## 2017-10-28 ENCOUNTER — Other Ambulatory Visit: Payer: Self-pay

## 2017-10-28 VITALS — BP 116/76 | HR 89 | Temp 98.6°F | Resp 16 | Ht 67.0 in | Wt 167.0 lb

## 2017-10-28 DIAGNOSIS — R05 Cough: Secondary | ICD-10-CM

## 2017-10-28 DIAGNOSIS — R059 Cough, unspecified: Secondary | ICD-10-CM

## 2017-10-28 LAB — POCT INFLUENZA A/B
INFLUENZA A, POC: NEGATIVE
INFLUENZA B, POC: NEGATIVE

## 2017-10-28 MED ORDER — GUAIFENESIN ER 1200 MG PO TB12: 1.0000 | ORAL_TABLET | Freq: Two times a day (BID) | ORAL | 1 refills | Status: DC | PRN

## 2017-10-28 NOTE — Patient Instructions (Addendum)
You can continue to take the ibuprofen every 6 hours, or your can swap tylenol and ibuprofen every 6-8 hours. This is likely the response to the flu vaccine.  But it should end within 48 hours. You can use the mucinex, but hydrate well with 64 oz., or more.       IF you received an x-ray today, you will receive an invoice from [facility]. Please contact [facility] at [phone] with questions or concerns regarding your invoice.   IF you received labwork today, you will receive an invoice from [facility]. Please contact [facility] at [phone] with questions or concerns regarding your invoice.   Our billing staff will not be able to assist you with questions regarding bills from these companies.  You will be contacted with the lab results as soon as they are available. The fastest way to get your results is to activate your My Chart account. Instructions are located on the last page of this paperwork. If you have not heard from [place] regarding the results in 2 weeks, please contact this office.

## 2017-10-28 NOTE — Progress Notes (Signed)
PRIMARY CARE AT [facility] [address] [place] [address] [phone]  Date:  10/28/2017   Name:  Jasmine Coffey   DOB:  24-May-1991   MRN:  [id number]  PCP:  Forrest Moron, MD    History of Present Illness:  Jasmine Coffey is a 26 y.o. female patient who presents to PCP with  Chief Complaint  Patient presents with  . Cough    x monday  . Generalized Body Aches     Patient reports that she has had 4 days of cough, bodyaches, feels like her skin is aching.  She reports that she feels subjective fever and chills, however her temperature always appeared to be normal.  She has sore throat, some ear discomfort.  The nsaids appear to help a lot.  She received a flu shot day prior to her symptoms.   She has not received a flu vaccine for a couple years.  No sob or dyspnea.  Patient Active Problem List   Diagnosis Date Noted  . Depression   . ADHD (attention deficit hyperactivity disorder)   . ADD (attention deficit disorder with hyperactivity) 01/16/2012  . Contraception management 01/16/2012    Past Medical History:  Diagnosis Date  . ADHD (attention deficit hyperactivity disorder)   . Asthma   . Depression     Past Surgical History:  Procedure Laterality Date  . ARTHROSCOPIC REPAIR ACL    . KNEE SURGERY      Social History   Tobacco Use  . Smoking status: Never Smoker  . Smokeless tobacco: Never Used  Substance Use Topics  . Alcohol use: Yes    Alcohol/week: 19.2 oz    Types: 8 Cans of beer, 8 Shots of liquor, 16 Standard drinks or equivalent per week    Comment: SOCIAL  . Drug use: No    Family History  Problem Relation Age of Onset  . Diabetes Father   . Cancer Maternal Grandfather     Allergies  Allergen Reactions  . Codeine Nausea And Vomiting  . Hydrocodone Other (See Comments)    Medication list has been reviewed and updated.  Current Outpatient Medications on File Prior to Visit  Medication Sig Dispense Refill  .  amphetamine-dextroamphetamine (ADDERALL) 20 MG tablet Take 1 tablet (20 mg total) 2 (two) times daily by mouth. 60 tablet 0  . ENSKYCE 0.15-30 MG-MCG tablet TAKE 1 ACTIVE TABLET A DAY CONTINUOUSLY FOR 3 MONTHS, THEN 1 WEEK OF PLACEBOS. 112 tablet 0  . escitalopram (LEXAPRO) 10 MG tablet Take 1 tablet (10 mg total) by mouth daily. 60 tablet 0  . Probiotic Product (ALIGN) 4 MG CAPS Take 4 mg by mouth daily. 90 capsule 3   No current facility-administered medications on file prior to visit.     ROS ROS otherwise unremarkable unless listed above.  Physical Examination: BP 116/76   Pulse 89   Temp 98.6 F (37 C) (Oral)   Resp 16   Ht 5\' 7"  (1.702 m)   Wt 167 lb (75.8 kg)   LMP 10/07/2017   SpO2 97%   BMI 26.16 kg/m  Ideal Body Weight: Weight in (lb) to have BMI = 25: 159.3  Physical Exam  Constitutional: She is oriented to person, place, and time. She appears well-developed and well-nourished. No distress.  HENT:  Head: Normocephalic and atraumatic.  Right Ear: Tympanic membrane, external ear and ear canal normal.  Left Ear: Tympanic membrane, external ear and ear canal normal.  Nose: No mucosal edema or rhinorrhea.  Right sinus exhibits no maxillary sinus tenderness and no frontal sinus tenderness. Left sinus exhibits no maxillary sinus tenderness and no frontal sinus tenderness.  Mouth/Throat: No uvula swelling. No oropharyngeal exudate, posterior oropharyngeal edema or posterior oropharyngeal erythema.  Eyes: Conjunctivae and EOM are normal. Pupils are equal, round, and reactive to light.  Cardiovascular: Normal rate and regular rhythm. Exam reveals no gallop, no distant heart sounds and no friction rub.  No murmur heard. Pulmonary/Chest: Effort normal. No respiratory distress. She has no decreased breath sounds. She has no wheezes. She has no rhonchi.  Lymphadenopathy:       Head (right side): No submandibular, no tonsillar, no preauricular and no posterior auricular adenopathy  present.       Head (left side): No submandibular, no tonsillar, no preauricular and no posterior auricular adenopathy present.  Neurological: She is alert and oriented to person, place, and time.  Skin: She is not diaphoretic.  Can visualize injection site with pinpoint erythematous area consistent with injection, no neighboring swelling or erythema or ecchymosis.   Psychiatric: She has a normal mood and affect. Her behavior is normal.     Assessment and Plan: Jasmine Coffey is a 26 y.o. female who is here today for cc of  Chief Complaint  Patient presents with  . Cough    x monday  . Generalized Body Aches   Symptoms appear to be viral, and possible immune response to relatively new vaccine.  Warned of side effects to warrant an immediate return.   Cough - Plan: POCT Influenza A/B  Ivar Drape, PA-C [facility] 11/16/20189:26 AM

## 2017-11-22 ENCOUNTER — Ambulatory Visit: Payer: Managed Care, Other (non HMO) | Admitting: Family Medicine

## 2017-11-24 NOTE — Progress Notes (Signed)
Chief Complaint  Patient presents with  . Medication Refill    adderall    HPI  ADD Pt has a diagnosis of ADD/ADHD She is taking daily and has good results She does not use other stimulants such as energy drinks or caffeine She is able to sleep at night and denies unintentional weight loss She denies mood swings She skips doses when not working or on weekends  Last fill of Adderall is 10/28/2017 She takes one tablet in the morning and a second dose if she has a long day.   Anxiety She stopped the Lexapro after having vomiting for a week She states that she tried to continue to see if she could get past it She reports that she has been having some anxiety episodes at night and cannot sleep even if she is not on her adderall.  She states that she is up thinking and worrying.  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     Past Medical History:  Diagnosis Date  . ADHD (attention deficit hyperactivity disorder)   . Asthma   . Depression     Current Outpatient Medications  Medication Sig Dispense Refill  . [START ON 01/26/2018] amphetamine-dextroamphetamine (ADDERALL) 20 MG tablet Take 1 tablet (20 mg total) by mouth 2 (two) times daily. 60 tablet 0  . ENSKYCE 0.15-30 MG-MCG tablet TAKE 1 ACTIVE TABLET A DAY CONTINUOUSLY FOR 3 MONTHS, THEN 1 WEEK OF PLACEBOS. 112 tablet 0  . Probiotic Product (ALIGN) 4 MG CAPS Take 4 mg by mouth daily. 90 capsule 3  . busPIRone (BUSPAR) 5 MG tablet Take 1 tablet (5 mg total) by mouth 3 (three) times daily. 30 tablet 0   No current facility-administered medications for this visit.     Allergies:  Allergies  Allergen Reactions  . Codeine Nausea And Vomiting  . Hydrocodone Other (See Comments)    Past Surgical History:  Procedure Laterality Date  . ARTHROSCOPIC REPAIR ACL    . KNEE SURGERY      Social History   Socioeconomic History  . Marital status: Married    Spouse name: None  . Number of  children: None  . Years of education: None  . Highest education level: None  Social Needs  . Financial resource strain: None  . Food insecurity - worry: None  . Food insecurity - inability: None  . Transportation needs - medical: None  . Transportation needs - non-medical: None  Occupational History  . None  Tobacco Use  . Smoking status: Never Smoker  . Smokeless tobacco: Never Used  Substance and Sexual Activity  . Alcohol use: Yes    Alcohol/week: 19.2 oz    Types: 8 Cans of beer, 8 Shots of liquor, 16 Standard drinks or equivalent per week    Comment: SOCIAL  . Drug use: No  . Sexual activity: Yes    Birth control/protection: None  Other Topics Concern  . None  Social History Narrative  . None    Family History  Problem Relation Age of Onset  . Diabetes Father   . Cancer Maternal Grandfather      Review of Systems  Constitutional: Negative for chills and fever.  Respiratory: Negative for cough, shortness of breath and wheezing.   Cardiovascular: Negative for chest pain, palpitations and leg swelling.  Gastrointestinal: Negative for abdominal pain, nausea and vomiting.  Skin: Negative for itching and rash.  Neurological: Negative for dizziness and headaches.  Psychiatric/Behavioral: Negative for  depression. The patient is nervous/anxious.       Objective: Vitals:   11/25/17 0830  BP: 120/60  Pulse: (!) 103  Resp: 16  Temp: 99 F (37.2 C)  TempSrc: Oral  SpO2: 97%  Weight: 166 lb 9.6 oz (75.6 kg)  Height: 5\' 7"  (1.702 m)    Physical Exam  Constitutional: She is oriented to person, place, and time. She appears well-developed and well-nourished.  HENT:  Head: Normocephalic and atraumatic.  Eyes: Conjunctivae and EOM are normal.  Cardiovascular: Normal rate, regular rhythm and normal heart sounds.  No murmur heard. Pulmonary/Chest: Effort normal and breath sounds normal. No stridor. No respiratory distress.  Neurological: She is alert and oriented to  person, place, and time.  Skin: Skin is warm. Capillary refill takes less than 2 seconds.  Psychiatric: She has a normal mood and affect. Her behavior is normal. Judgment and thought content normal.     Assessment and Plan Sharlette was seen today for medication refill.  Diagnoses and all orders for this visit:  Attention deficit hyperactivity disorder (ADHD), predominantly inattentive type- printed 3 scripts for 3 months Pt will give all 3 to pharmacist Follow up in 3 months  Generalized anxiety disorder with panic attacks- discussed that clonazepam is a benzodiazepine that is habit forming Discussed trying something that is as needed for short term episodes  -     busPIRone (BUSPAR) 5 MG tablet; Take 1 tablet (5 mg total) by mouth 3 (three) times daily.  Other orders -     Discontinue: amphetamine-dextroamphetamine (ADDERALL) 20 MG tablet; Take 1 tablet (20 mg total) by mouth 2 (two) times daily. -     Discontinue: amphetamine-dextroamphetamine (ADDERALL) 20 MG tablet; Take 1 tablet (20 mg total) by mouth 2 (two) times daily. -     amphetamine-dextroamphetamine (ADDERALL) 20 MG tablet; Take 1 tablet (20 mg total) by mouth 2 (two) times daily.   Review of the [place] PMP AWARE shows appropriate prescriptions  A total of 25 minutes were spent face-to-face with the patient during this encounter and over half of that time was spent on counseling and coordination of care.    [place]

## 2017-11-25 ENCOUNTER — Other Ambulatory Visit: Payer: Self-pay

## 2017-11-25 ENCOUNTER — Encounter: Payer: Self-pay | Admitting: Family Medicine

## 2017-11-25 ENCOUNTER — Ambulatory Visit (INDEPENDENT_AMBULATORY_CARE_PROVIDER_SITE_OTHER): Payer: Managed Care, Other (non HMO) | Admitting: Family Medicine

## 2017-11-25 VITALS — BP 120/60 | HR 103 | Temp 99.0°F | Resp 16 | Ht 67.0 in | Wt 166.6 lb

## 2017-11-25 DIAGNOSIS — F410 Panic disorder [episodic paroxysmal anxiety] without agoraphobia: Secondary | ICD-10-CM | POA: Diagnosis not present

## 2017-11-25 DIAGNOSIS — F900 Attention-deficit hyperactivity disorder, predominantly inattentive type: Secondary | ICD-10-CM

## 2017-11-25 DIAGNOSIS — F411 Generalized anxiety disorder: Secondary | ICD-10-CM | POA: Diagnosis not present

## 2017-11-25 MED ORDER — AMPHETAMINE-DEXTROAMPHETAMINE 20 MG PO TABS: 20.0000 mg | ORAL_TABLET | Freq: Two times a day (BID) | ORAL | 0 refills | Status: DC

## 2017-11-25 MED ORDER — BUSPIRONE HCL 5 MG PO TABS: 5.0000 mg | ORAL_TABLET | Freq: Three times a day (TID) | ORAL | 0 refills | Status: AC

## 2017-12-27 ENCOUNTER — Encounter: Payer: Self-pay | Admitting: Family Medicine

## 2017-12-27 ENCOUNTER — Other Ambulatory Visit: Payer: Self-pay | Admitting: Family Medicine

## 2017-12-27 NOTE — Telephone Encounter (Signed)
Called patient to inform appointment needed for refill.

## 2017-12-27 NOTE — Telephone Encounter (Signed)
Called patient at [phone], left VM to call back to schedule an appointment in order to get prescription refilled of Enskyce as noted by the provider at the last refill.

## 2017-12-27 NOTE — Telephone Encounter (Signed)
Refill request for enskyce 0.15-30 mcg #112 with 11 refills per [place]ings.

## 2018-01-04 ENCOUNTER — Other Ambulatory Visit: Payer: Self-pay | Admitting: Family Medicine

## 2018-01-04 MED ORDER — DESOGESTREL-ETHINYL ESTRADIOL 0.15-30 MG-MCG PO TABS: ORAL_TABLET | ORAL | 2 refills | Status: DC

## 2018-01-13 ENCOUNTER — Encounter: Payer: Self-pay | Admitting: Family Medicine

## 2018-01-13 ENCOUNTER — Other Ambulatory Visit: Payer: Self-pay

## 2018-01-13 ENCOUNTER — Ambulatory Visit (INDEPENDENT_AMBULATORY_CARE_PROVIDER_SITE_OTHER): Payer: 59 | Admitting: Family Medicine

## 2018-01-13 VITALS — BP 116/70 | HR 96 | Temp 98.4°F | Resp 16 | Ht 67.0 in | Wt 163.0 lb

## 2018-01-13 DIAGNOSIS — F411 Generalized anxiety disorder: Secondary | ICD-10-CM

## 2018-01-13 DIAGNOSIS — F900 Attention-deficit hyperactivity disorder, predominantly inattentive type: Secondary | ICD-10-CM | POA: Diagnosis not present

## 2018-01-13 DIAGNOSIS — R0981 Nasal congestion: Secondary | ICD-10-CM

## 2018-01-13 DIAGNOSIS — F410 Panic disorder [episodic paroxysmal anxiety] without agoraphobia: Secondary | ICD-10-CM | POA: Diagnosis not present

## 2018-01-13 MED ORDER — OXYMETAZOLINE HCL 0.05 % NA SOLN: 1.0000 | Freq: Two times a day (BID) | NASAL | 0 refills | Status: AC

## 2018-01-13 NOTE — Patient Instructions (Addendum)
  [facility] accepts most insurances. For questions regarding [facility]s participation in your insurance plan, please call our billing and insurance office at [phone].  For information regarding your particular plan's benefits, please contact your insurance company directly.    [place], [place],  [address]  [phone]     Milta De[facility]ers Med sinus rinse     IF you received an x-ray today, you will receive an invoice from [facility]. Please contact [facility] at [phone] with questions or concerns regarding your invoice.   IF you received labwork today, you will receive an invoice from [facility]. Please contact [facility] at [phone] with questions or concerns regarding your invoice.   Our billing staff will not be able to assist you with questions regarding bills from these companies.  You will be contacted with the lab results as soon as they are available. The fastest way to get your results is to activate your My Chart account. Instructions are located on the last page of this paperwork. If you have not heard from [place] regarding the results in 2 weeks, please contact this office.

## 2018-01-13 NOTE — Progress Notes (Signed)
Chief Complaint  Patient presents with  . URI    since last 01/05/18, per pt head pounding and throat sore every morning, very nauseous all the time and groggy feeling.  Per pt she is not pregnant as she took a preg test this morning and it was negative.  Per pt she would like to go back to taking the 1st medication prescribed for her anxiety as the med dr Nolon Rod prescribed didnt    HPI  Pt reports that she has facial pressure with nausea Onset was 01/05/2018 She states that she missed work last Wednesday  She tried otc ibuprofen, water and dayquil She denies coughing She reports that she gets facial pressure worse at the forehead and maxillary area She denies dizziness +nausea  She is nauseous and feels like she is constantly car sick She reports that she has sweats but no fevers  Generalized Anxiety Disorder Panic Attacks She reports that she has noted worsening anxiety in the past few months She feels very stressed and is trying yoga and healthy alternatives She gets panic attacks as often as 4x a week 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 ADHD (attention deficit hyperactivity disorder)   . Asthma   . Depression     Current Outpatient Medications  Medication Sig Dispense Refill  . [START ON 01/26/2018] amphetamine-dextroamphetamine (ADDERALL) 20 MG tablet Take 1 tablet (20 mg total) by mouth 2 (two) times daily. 60 tablet 0  . desogestrel-ethinyl estradiol (ENSKYCE) 0.15-30 MG-MCG tablet TAKE 1 ACTIVE TABLET A DAY CONTINUOUSLY FOR 3 MONTHS, THEN 1 WEEK OF PLACEBOS. 112 tablet 2  . Probiotic Product (ALIGN) 4 MG CAPS Take 4 mg by mouth daily. 90 capsule 3  . oxymetazoline (AFRIN NASAL SPRAY) 0.05 % nasal spray Place 1 spray into both nostrils 2 (two) times daily. 30 mL 0    No current facility-administered medications for this visit.     Allergies:  Allergies  Allergen Reactions  . Codeine Nausea And Vomiting  . Hydrocodone Other (See Comments)    Past Surgical History:  Procedure Laterality Date  . ARTHROSCOPIC REPAIR ACL    . KNEE SURGERY      Social History   Socioeconomic History  . Marital status: Married    Spouse name: None  . Number of children: None  . Years of education: None  . Highest education level: None  Social Needs  . Financial resource strain: None  . Food insecurity - worry: None  . Food insecurity - inability: None  . Transportation needs - medical: None  . Transportation needs - non-medical: None  Occupational History  . None  Tobacco Use  . Smoking status: Never Smoker  . Smokeless tobacco: Never Used  Substance and Sexual Activity  . Alcohol use: Yes    Alcohol/week: 19.2 oz    Types: 8 Cans of beer, 8 Shots of liquor, 16 Standard drinks or equivalent per week    Comment: SOCIAL  . Drug use: No  . Sexual activity: Yes    Birth control/protection: None  Other Topics Concern  . None  Social History Narrative  . None    Family History  Problem Relation Age of Onset  . Diabetes Father   . Cancer Maternal Grandfather  ROS Review of Systems See HPI Constitution: No fevers or chills No malaise No diaphoresis Skin: No rash or itching Eyes: no blurry vision, no double vision GU: no dysuria or hematuria Neuro: no dizziness or headaches * all others reviewed and negative   Objective: Vitals:   01/13/18 1640  BP: 116/70  Pulse: 96  Resp: 16  Temp: 98.4 F (36.9 C)  TempSrc: Oral  SpO2: 100%  Weight: 163 lb (73.9 kg)  Height: 5\' 7"  (1.702 m)    Physical Exam General: alert, oriented, in NAD Head: normocephalic, atraumatic, no sinus tenderness Eyes: EOM intact, no scleral icterus or conjunctival injection Ears: TM clear bilaterally Nose: mucosa nonerythematous, nonedematous Throat:  no pharyngeal exudate or erythema Lymph: no posterior auricular, submental or cervical lymph adenopathy Heart: normal rate, normal sinus rhythm, no murmurs Lungs: clear to auscultation bilaterally, no wheezing   Assessment and Plan Jasmine Coffey was seen today for uri.  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  A total of 30 minutes were spent face-to-face with the patient during this encounter and over half of that time was spent on counseling and coordination of care.    [facility]

## 2018-02-21 ENCOUNTER — Other Ambulatory Visit: Payer: Self-pay | Admitting: Family Medicine

## 2018-02-22 NOTE — Telephone Encounter (Signed)
Please advise. Dgaddy, CMA 

## 2018-02-24 ENCOUNTER — Other Ambulatory Visit: Payer: Self-pay | Admitting: Family Medicine

## 2018-02-24 MED ORDER — AMPHETAMINE-DEXTROAMPHETAMINE 20 MG PO TABS: 20.0000 mg | ORAL_TABLET | Freq: Two times a day (BID) | ORAL | 0 refills | Status: DC

## 2018-03-23 ENCOUNTER — Encounter: Payer: Self-pay | Admitting: Physician Assistant

## 2018-03-27 ENCOUNTER — Other Ambulatory Visit: Payer: Self-pay | Admitting: Family Medicine

## 2018-03-30 MED ORDER — AMPHETAMINE-DEXTROAMPHETAMINE 20 MG PO TABS: 20.0000 mg | ORAL_TABLET | Freq: Two times a day (BID) | ORAL | 0 refills | Status: DC

## 2018-03-30 NOTE — Telephone Encounter (Signed)
Med request: Adderrall (requested on 03/27/18-patient checking on status)  Last OV: 01/13/18; patient will need F/U 03/2018 as noted, no appt scheduled  PCP: [place]: [id number] [address] [place], [place] [phone] (Phone) [phone] (Fax)

## 2018-03-30 NOTE — Telephone Encounter (Signed)
Pt. Is calling on status of Adderall.   [facility] call [facility] [address] in [place]

## 2018-04-05 ENCOUNTER — Encounter: Payer: Self-pay | Admitting: Family Medicine

## 2018-04-27 ENCOUNTER — Other Ambulatory Visit: Payer: Self-pay | Admitting: Family Medicine

## 2018-04-27 ENCOUNTER — Encounter: Payer: Self-pay | Admitting: Family Medicine

## 2018-04-27 NOTE — Telephone Encounter (Signed)
Pt also sent MyChart message - forwarded both to Dr. Nolon Rod

## 2018-04-28 MED ORDER — AMPHETAMINE-DEXTROAMPHETAMINE 20 MG PO TABS: 20.0000 mg | ORAL_TABLET | Freq: Two times a day (BID) | ORAL | 0 refills | Status: DC

## 2018-05-24 ENCOUNTER — Other Ambulatory Visit: Payer: Self-pay | Admitting: Family Medicine

## 2018-05-24 ENCOUNTER — Encounter: Payer: Self-pay | Admitting: Family Medicine

## 2018-05-25 MED ORDER — AMPHETAMINE-DEXTROAMPHETAMINE 20 MG PO TABS
20.0000 mg | ORAL_TABLET | Freq: Two times a day (BID) | ORAL | 0 refills | Status: DC
Start: 2018-05-25 — End: 2018-06-02

## 2018-05-25 NOTE — Telephone Encounter (Signed)
Patient is requesting a refill of the following medications: Requested Prescriptions   Pending Prescriptions Disp Refills  . amphetamine-dextroamphetamine (ADDERALL) 20 MG tablet 60 tablet 0    Sig: Take 1 tablet (20 mg total) by mouth 2 (two) times daily. Office visit due June.    Date of patient request: 05/25/2018 Last office visit: 01/13/18 Date of last refill:04/28/18 Last refill amount: #60 Follow up time period per chart: return in 3 months (around 04/12/18)  Please advise. Dgaddy, CMA

## 2018-05-27 ENCOUNTER — Encounter (HOSPITAL_COMMUNITY): Payer: Self-pay | Admitting: Emergency Medicine

## 2018-05-27 ENCOUNTER — Ambulatory Visit (HOSPITAL_COMMUNITY)
Admission: EM | Admit: 2018-05-27 | Discharge: 2018-05-27 | Disposition: A | Discharge status: Home / Self Care | Payer: Self-pay | Attending: Emergency Medicine | Admitting: Emergency Medicine

## 2018-05-27 DIAGNOSIS — S162XXA Laceration of muscle, fascia and tendon at neck level, initial encounter: Secondary | ICD-10-CM

## 2018-05-27 DIAGNOSIS — R42 Dizziness and giddiness: Secondary | ICD-10-CM

## 2018-05-27 DIAGNOSIS — S161XXA Strain of muscle, fascia and tendon at neck level, initial encounter: Secondary | ICD-10-CM

## 2018-05-27 DIAGNOSIS — S060X0A Concussion without loss of consciousness, initial encounter: Secondary | ICD-10-CM

## 2018-05-27 MED ORDER — ONDANSETRON 4 MG PO TBDP: 4.0000 mg | ORAL_TABLET | Freq: Three times a day (TID) | ORAL | 0 refills | Status: DC | PRN

## 2018-05-27 MED ORDER — IBUPROFEN 800 MG PO TABS: 800.0000 mg | ORAL_TABLET | Freq: Three times a day (TID) | ORAL | 0 refills | Status: DC

## 2018-05-27 MED ORDER — CYCLOBENZAPRINE HCL 10 MG PO TABS: 10.0000 mg | ORAL_TABLET | Freq: Two times a day (BID) | ORAL | 0 refills | Status: AC | PRN
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
## 2018-05-27 NOTE — Discharge Instructions (Addendum)
Use anti-inflammatories for pain/swelling. You may take up to 800 mg Ibuprofen every 8 hours with food. You may supplement Ibuprofen with Tylenol [phone] every 8 hours.   You may use flexeril as needed to help with pain. This is a muscle relaxer and causes sedation- please use only at bedtime or when you will be home and not have to drive/work  Ice and heating pad   Zofran for nausea  Please treat your headache and symptoms as a concussion by mental rest and avoiding screens.  If doing any activities that worsens her symptoms please stop and rest for a couple more days.  I expect pain to worsen over the next couple of days followed by gradual improvement over the next 2 weeks.  Please return if symptoms significantly worsening, changing, developing numbness or tingling, no improvement in 1 to 2 weeks.
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
## 2018-05-31 ENCOUNTER — Ambulatory Visit (HOSPITAL_COMMUNITY)
Admission: EM | Admit: 2018-05-31 | Discharge: 2018-05-31 | Disposition: A | Discharge status: Home / Self Care | Payer: Self-pay | Attending: Internal Medicine | Admitting: Internal Medicine

## 2018-05-31 ENCOUNTER — Encounter (HOSPITAL_COMMUNITY): Payer: Self-pay | Admitting: Emergency Medicine

## 2018-05-31 DIAGNOSIS — S060X0D Concussion without loss of consciousness, subsequent encounter: Secondary | ICD-10-CM

## 2018-05-31 MED ORDER — METOCLOPRAMIDE HCL 5 MG/ML IJ SOLN
5.0000 mg | Freq: Once | INTRAMUSCULAR | Status: AC
  Administered 2018-05-31: 5 mg via INTRAMUSCULAR

## 2018-05-31 MED ORDER — KETOROLAC TROMETHAMINE 60 MG/2ML IM SOLN
60.0000 mg | Freq: Once | INTRAMUSCULAR | Status: AC
  Administered 2018-05-31: 60 mg via INTRAMUSCULAR

## 2018-05-31 MED ORDER — METOCLOPRAMIDE HCL 5 MG/ML IJ SOLN
INTRAMUSCULAR | Status: AC
  Filled 2018-05-31: qty 2

## 2018-05-31 MED ORDER — DEXAMETHASONE SODIUM PHOSPHATE 10 MG/ML IJ SOLN
INTRAMUSCULAR | Status: AC
  Filled 2018-05-31: qty 1

## 2018-05-31 MED ORDER — DEXAMETHASONE SODIUM PHOSPHATE 10 MG/ML IJ SOLN
10.0000 mg | Freq: Once | INTRAMUSCULAR | Status: AC
  Administered 2018-05-31: 10 mg via INTRAMUSCULAR

## 2018-05-31 MED ORDER — HYDROXYZINE PAMOATE 100 MG PO CAPS: 100.0000 mg | ORAL_CAPSULE | Freq: Three times a day (TID) | ORAL | 0 refills | Status: AC | PRN

## 2018-05-31 MED ORDER — KETOROLAC TROMETHAMINE 60 MG/2ML IM SOLN
INTRAMUSCULAR | Status: AC
  Filled 2018-05-31: qty 2
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
## 2018-05-31 NOTE — Discharge Instructions (Signed)
We gave you a shot of Toradol, Reglan and Decadron today to help with your headache  Please continue anti-inflammatories and muscle relaxer for muscle/neck/back pain  Vistaril as needed to help with anxiety in relation to cars, do not drive while taking  Continue mental rest

## 2018-06-02 MED ORDER — AMPHETAMINE-DEXTROAMPHETAMINE 20 MG PO TABS: 20.0000 mg | ORAL_TABLET | Freq: Two times a day (BID) | ORAL | 0 refills | Status: DC

## 2018-06-20 ENCOUNTER — Encounter: Payer: Self-pay | Admitting: Family Medicine

## 2018-06-20 ENCOUNTER — Ambulatory Visit (INDEPENDENT_AMBULATORY_CARE_PROVIDER_SITE_OTHER): Payer: 59 | Admitting: Family Medicine

## 2018-06-20 ENCOUNTER — Other Ambulatory Visit: Payer: Self-pay

## 2018-06-20 VITALS — BP 124/80 | HR 71 | Temp 98.2°F | Ht 69.0 in | Wt 165.2 lb

## 2018-06-20 DIAGNOSIS — F988 Other specified behavioral and emotional disorders with onset usually occurring in childhood and adolescence: Secondary | ICD-10-CM

## 2018-06-20 MED ORDER — AMPHETAMINE-DEXTROAMPHETAMINE 20 MG PO TABS: 20.0000 mg | ORAL_TABLET | Freq: Two times a day (BID) | ORAL | 0 refills | Status: DC

## 2018-06-20 NOTE — Patient Instructions (Addendum)
  Check with oyur pharmacy, but you should have refill of contraceptive. Please schedule a pap test or physical with primary care provider.   I refilled the Adderall to fill after July 17th, but let us know when your psychiatry appointment is scheduled. Based on current use should have enough through end of July, and next Rx may last up to 6 weeks as well.   Follow up with psychiatry as planned. Based on chart review, you have taken Prozac, Lexapro and Zoloft.   Return to the clinic or go to the nearest emergency room if any of your symptoms worsen or new symptoms occur.     IF you received an x-ray today, you will receive an invoice from [facility]. Please contact [facility] at [phone] with questions or concerns regarding your invoice.   IF you received labwork today, you will receive an invoice from [place]. Please contact [facility] at [phone] with questions or concerns regarding your invoice.   Our billing staff will not be able to assist you with questions regarding bills from these companies.  You will be contacted with the lab results as soon as they are available. The fastest way to get your results is to activate your My Chart account. Instructions are located on the last page of this paperwork. If you have not heard from [place] regarding the results in 2 weeks, please contact this office.

## 2018-06-20 NOTE — Progress Notes (Signed)
Subjective:  By signing my name below, I, Jasmine Coffey, attest that this documentation has been prepared under the direction and in the presence of Jasmine Agreste, MD Electronically Signed: Ladene Artist, ED Scribe 06/20/2018 at 11:33 AM.   Patient ID: Jasmine Coffey, female    DOB: Jul 11, 1991, 27 y.o.   MRN: [id number]  Chief Complaint  Patient presents with  . Medication Refill    adderall and BC    HPI [name] is a 27 y.o. female who presents to Primary Care at [facility] for med refills. PCP Forrest Moron, MD.  Anxiety Discussed 1/31 with Dr. Nolon Rod. Panic attacks, worsening anxiety x a few months. She tried yoga and other healthy alternatives. Panic attacks up to 4 a wk. Advised to contact psychiatry for ADHD and panic attacks. Concerns that stimulant may be worsening anxiety. Recommended against restarting klonopin. She was given number to [facility] and [facility]. Has been on many SSRIs and buspar with limited success. - Pt states that her anxiety is stress induced, doesn't occur on a daily. Denies insomnia at this time. She is still awaiting a call from a psychiatrist. Depression screen [facility] 2/9 06/20/2018 01/13/2018 11/25/2017 10/28/2017 05/27/2017  Decreased Interest 0 0 0 0 0  Down, Depressed, Hopeless 0 0 0 0 0  PHQ - 2 Score 0 0 0 0 0   ADD Reported in Jan, she was trying to decrease adderall. Was taking 20 mg up to 2 times/day. Given 3 months of meds in 11/2017. I saw her once in 01/2017 for rx refills, at that times was on high dose of 30 mg bid adderall, provided 1 month prescriptions. She had a recent concussion, seen by [place] [place] 7/1, referred to psych and neuro at that time. - Pt is currently taking 20 mg bid 5 days/wk (does not take Adderall on the weekends). States she is not out at this time but will run out after 7/15. Last fill date was 6/17, consistent with PMP aware. Denies issues at the lower dose, heart  palpitations, cp, appetite change. Wt Readings from Last 3 Encounters:  06/20/18 165 lb 3.2 oz (74.9 kg)  05/31/18 164 lb (74.4 kg)  01/13/18 163 lb (73.9 kg)   Contraceptive Counseling Enskyce qd x 3 months, 1 wk of placebo. Last filled in Jan with 2 refills. Last pap in 04/2013, neg.  Patient Active Problem List   Diagnosis Date Noted  . Depression   . ADHD (attention deficit hyperactivity disorder)   . ADD (attention deficit disorder with hyperactivity) 01/16/2012  . Contraception management 01/16/2012   Past Medical History:  Diagnosis Date  . ADHD (attention deficit hyperactivity disorder)   . Asthma   . Depression    Past Surgical History:  Procedure Laterality Date  . ARTHROSCOPIC REPAIR ACL    . KNEE SURGERY     Allergies  Allergen Reactions  . Codeine Nausea And Vomiting  . Hydrocodone Other (See Comments)   Prior to Admission medications   Medication Sig Start Date End Date Taking? Authorizing Provider  amphetamine-dextroamphetamine (ADDERALL) 20 MG tablet Take 1 tablet (20 mg total) by mouth 2 (two) times daily. Office visit due June. 06/02/18   Forrest Moron, MD  cyclobenzaprine (FLEXERIL) 10 MG tablet Take 1 tablet (10 mg total) by mouth 2 (two) times daily as needed for muscle spasms. 05/27/18   Wieters, Hallie C, PA-C  desogestrel-ethinyl estradiol (ENSKYCE) 0.15-30 MG-MCG tablet TAKE 1 ACTIVE TABLET A DAY CONTINUOUSLY  FOR 3 MONTHS, THEN 1 WEEK OF PLACEBOS. 01/04/18   Delia Chimes A, MD  hydrOXYzine (VISTARIL) 100 MG capsule Take 1 capsule (100 mg total) by mouth 3 (three) times daily as needed for itching. 05/31/18   Wieters, Hallie C, PA-C  ibuprofen (ADVIL,MOTRIN) 800 MG tablet Take 1 tablet (800 mg total) by mouth 3 (three) times daily. 05/27/18   Wieters, Hallie C, PA-C  ondansetron (ZOFRAN ODT) 4 MG disintegrating tablet Take 1 tablet (4 mg total) by mouth every 8 (eight) hours as needed for nausea or vomiting. 05/27/18   Wieters, Hallie C, PA-C    oxymetazoline (AFRIN NASAL SPRAY) 0.05 % nasal spray Place 1 spray into both nostrils 2 (two) times daily. 01/13/18   Forrest Moron, MD  Probiotic Product (ALIGN) 4 MG CAPS Take 4 mg by mouth daily. 04/17/15   Elby Beck, FNP   Social History   Socioeconomic History  . Marital status: Married    Spouse name: Not on file  . Number of children: Not on file  . Years of education: Not on file  . Highest education level: Not on file  Occupational History  . Not on file  Social Needs  . Financial resource strain: Not on file  . Food insecurity:    Worry: Not on file    Inability: Not on file  . Transportation needs:    Medical: Not on file    Non-medical: Not on file  Tobacco Use  . Smoking status: Never Smoker  . Smokeless tobacco: Never Used  Substance and Sexual Activity  . Alcohol use: Yes    Alcohol/week: 19.2 oz    Types: 8 Cans of beer, 8 Shots of liquor, 16 Standard drinks or equivalent per week    Comment: SOCIAL  . Drug use: No  . Sexual activity: Yes    Birth control/protection: None  Lifestyle  . Physical activity:    Days per week: Not on file    Minutes per session: Not on file  . Stress: Not on file  Relationships  . Social connections:    Talks on phone: Not on file    Gets together: Not on file    Attends religious service: Not on file    Active member of club or organization: Not on file    Attends meetings of clubs or organizations: Not on file    Relationship status: Not on file  . Intimate partner violence:    Fear of current or ex partner: Not on file    Emotionally abused: Not on file    Physically abused: Not on file    Forced sexual activity: Not on file  Other Topics Concern  . Not on file  Social History Narrative  . Not on file   Review of Systems  Constitutional: Negative for appetite change.  Cardiovascular: Negative for chest pain and palpitations.  Psychiatric/Behavioral: Negative for sleep disturbance.      Objective:    Physical Exam  Constitutional: She is oriented to person, place, and time. She appears well-developed and well-nourished. No distress.  HENT:  Head: Normocephalic and atraumatic.  Eyes: Conjunctivae and EOM are normal.  Neck: Neck supple. No tracheal deviation present.  Cardiovascular: Normal rate.  Pulmonary/Chest: Effort normal. No respiratory distress.  Musculoskeletal: Normal range of motion.  Neurological: She is alert and oriented to person, place, and time.  Skin: Skin is warm and dry.  Psychiatric: She has a normal mood and affect. Her behavior is normal.  Nursing note and vitals reviewed.  Vitals:   06/20/18 1107  BP: (!) 132/95  Pulse: 71  Temp: 98.2 F (36.8 C)  TempSrc: Oral  SpO2: 99%  Weight: 165 lb 3.2 oz (74.9 kg)  Height: 5\' 9"  (1.753 m)      Assessment & Plan:   Lizbett Garciagarcia is a 27 y.o. female Attention deficit disorder, unspecified hyperactivity presence - Plan: amphetamine-dextroamphetamine (ADDERALL) 20 MG tablet  -Reports overall stable symptoms at current dose of Adderall 20 mg twice daily with use during the workweek, able to skip weekends.  Denies any worsening anxiety with that regimen, and states her anxiety is more situational/reactive.  Still would agree with meeting with psychiatry as previously recommended here as well as provider at [facility].     -New prescription given for Adderall to fill July 17, and based on current dosing may last up to 6 weeks if she is not needing medicine on weekends.  -She is to let us know when she does obtain psychiatry appointment to help with determining refill timing of Adderall in the meantime.   - I also provided the names of her previous SSRIs that she has been given to review those with psychiatry and any side effects or intolerances she had with those medications to help in deciding on other treatments.  Contraception discussed, is tolerating current regimen, and appears to have another refill of her  26-month supply.  Also discussed need for repeat Pap testing as appears she is due.  Plans on scheduling with other primary provider.   Meds ordered this encounter  Medications  . amphetamine-dextroamphetamine (ADDERALL) 20 MG tablet    Sig: Take 1 tablet (20 mg total) by mouth 2 (two) times daily.    Dispense:  60 tablet    Refill:  0    Fill July 17th or later.   Patient Instructions    Check with oyur pharmacy, but you should have refill of contraceptive. Please schedule a pap test or physical with primary care provider.   I refilled the Adderall to fill after July 17th, but let us know when your psychiatry appointment is scheduled. Based on current use should have enough through end of July, and next Rx may last up to 6 weeks as well.   Follow up with psychiatry as planned. Based on chart review, you have taken Prozac, Lexapro and Zoloft.   Return to the clinic or go to the nearest emergency room if any of your symptoms worsen or new symptoms occur.     IF you received an x-ray today, you will receive an invoice from [facility]. Please contact [facility] at [phone] with questions or concerns regarding your invoice.   IF you received labwork today, you will receive an invoice from [place]. Please contact [facility] at [phone] with questions or concerns regarding your invoice.   Our billing staff will not be able to assist you with questions regarding bills from these companies.  You will be contacted with the lab results as soon as they are available. The fastest way to get your results is to activate your My Chart account. Instructions are located on the last page of this paperwork. If you have not heard from [place] regarding the results in 2 weeks, please contact this office.       I personally performed the services described in this documentation, which was scribed in my presence. The recorded information has been reviewed and considered for accuracy  and completeness, addended by  me as needed, and agree with information above.  Signed,   Merri Ray, MD Primary Care at [facility].  06/21/18 3:06 PM

## 2019-02-28 ENCOUNTER — Other Ambulatory Visit: Payer: Self-pay | Admitting: Family Medicine

## 2019-03-01 NOTE — Telephone Encounter (Signed)
Requested medication (s) are due for refill today: Yes  Requested medication (s) are on the active medication list: Yes  Last refill:  12/2017  Future visit scheduled: No  Notes to clinic:  See request    Requested Prescriptions  Pending Prescriptions Disp Refills   APRI 0.15-30 MG-MCG tablet [Pharmacy Med Name: APRI 28 DAY TABLET] 84 tablet 15    Sig: TAKE 1 ACTIVE TABLET A DAY CONTINUOUSLY FOR 3 MONTHS, THEN 1 WEEK OF PLACEBOS.     OB/GYN:  Contraceptives Passed - 02/28/2019  7:46 PM      Passed - Last BP in normal range    BP Readings from Last 1 Encounters:  06/20/18 124/80         Passed - Valid encounter within last 12 months    Recent Outpatient Visits          8 months ago Attention deficit disorder, unspecified hyperactivity presence   Primary Care at [facility], Ranell Patrick, MD   1 year ago Panic attacks   Primary Care at [facility], [place], MD   1 year ago Attention deficit hyperactivity disorder (ADHD), predominantly inattentive type   Primary Care at [facility], Arlie Solomons, MD   1 year ago Cough   Primary Care at [place], [place], [place]   1 year ago Adjustment disorder with anxious mood   Primary Care at [facility], Arlie Solomons, MD

## 2022-12-14 NOTE — L&D Delivery Note (Signed)
Delivery Note At 2:22 PM a viable and healthy female was delivered via Vaginal, Spontaneous (Presentation: Left Occiput Anterior).  APGAR: 9, 9; weight 8 lb 2.2 oz (3690 g).   Placenta status: Spontaneous, Intact.  Cord: 3 vessels with the following complications: None.  Cord pH: na  Anesthesia: Epidural Episiotomy: None Lacerations: 2nd degree Suture Repair: 2.0 vicryl rapide Est. Blood Loss (mL): 200  Mom to postpartum.  Baby to Couplet care / Skin to Skin.  Paije Goodhart J 10/18/2023, 5:56 PM

## 2023-03-25 LAB — OB RESULTS CONSOLE HIV ANTIBODY (ROUTINE TESTING): HIV: NONREACTIVE

## 2023-03-25 LAB — OB RESULTS CONSOLE RPR: RPR: NONREACTIVE

## 2023-03-25 LAB — OB RESULTS CONSOLE RUBELLA ANTIBODY, IGM: Rubella: IMMUNE

## 2023-03-25 LAB — OB RESULTS CONSOLE HEPATITIS B SURFACE ANTIGEN: Hepatitis B Surface Ag: NEGATIVE

## 2023-04-22 LAB — OB RESULTS CONSOLE GC/CHLAMYDIA
Chlamydia: NEGATIVE
Neisseria Gonorrhea: NEGATIVE

## 2023-08-03 LAB — OB RESULTS CONSOLE RPR: RPR: NONREACTIVE

## 2023-09-15 LAB — OB RESULTS CONSOLE GBS: GBS: POSITIVE

## 2023-10-09 ENCOUNTER — Encounter (HOSPITAL_COMMUNITY): Payer: Self-pay | Admitting: Obstetrics and Gynecology

## 2023-10-09 ENCOUNTER — Inpatient Hospital Stay (HOSPITAL_COMMUNITY)
Admission: AD | Admit: 2023-10-09 | Discharge: 2023-10-10 | Disposition: A | Discharge status: Home / Self Care | Payer: BC Managed Care – PPO | Attending: Obstetrics and Gynecology | Admitting: Obstetrics and Gynecology

## 2023-10-09 DIAGNOSIS — O479 False labor, unspecified: Secondary | ICD-10-CM

## 2023-10-09 DIAGNOSIS — Z3A38 38 weeks gestation of pregnancy: Secondary | ICD-10-CM

## 2023-10-09 DIAGNOSIS — O471 False labor at or after 37 completed weeks of gestation: Secondary | ICD-10-CM | POA: Insufficient documentation

## 2023-10-09 NOTE — MAU Note (Signed)
.  Jasmine Coffey is a 32 y.o. at Unknown here in MAU reporting: contractions every 3 minutes-unsure if she is in labor. Wants SVE Denies SROM, or vaginal bleeding-reports bloody show after membrane sweep. Endorses + fetal movement Denies complications of problems with pregnancy  Onset of complaint: all evening Pain score: 5 contraction Vitals:   10/09/23 2349 10/10/23 0046  BP: 113/82 119/83  Pulse: 98 72  Resp: 17 18  Temp: 98 F (36.7 C) 97.9 F (36.6 C)  SpO2: 100% 100%     FHT:155bpm Lab orders placed from triage:  mau labor

## 2023-10-09 NOTE — MAU Note (Incomplete)
.  Jasmine Coffey is a 32 y.o. at Unknown here in MAU reporting: contractions every 3 minutes-unsure if she is in labor. Wants SVE Denies SROM, or vaginal bleeding-reports bloody show after membrane sweep. Endorses + fetal movement Denies complications of problems with pregnancy  Onset of complaint: all evening Pain score: 5 contraction There were no vitals filed for this visit.   FHT:*** Lab orders placed from triage:  mau labor

## 2023-10-10 ENCOUNTER — Other Ambulatory Visit: Payer: Self-pay

## 2023-10-10 ENCOUNTER — Encounter (HOSPITAL_COMMUNITY): Payer: Self-pay | Admitting: Obstetrics and Gynecology

## 2023-10-10 DIAGNOSIS — Z3A38 38 weeks gestation of pregnancy: Secondary | ICD-10-CM | POA: Diagnosis not present

## 2023-10-10 DIAGNOSIS — O479 False labor, unspecified: Secondary | ICD-10-CM | POA: Diagnosis not present

## 2023-10-10 DIAGNOSIS — O471 False labor at or after 37 completed weeks of gestation: Secondary | ICD-10-CM | POA: Diagnosis present

## 2023-10-10 NOTE — MAU Provider Note (Signed)
Ms. ENIYA CAZIER is a G1P0 at [redacted]w[redacted]d seen in MAU for labor. RN labor check, not seen by provider.   SVE by RN Dilation: Closed Effacement (%): Thick Station: -3 Presentation: Vertex Exam by:: Early Chars, RN   NST - FHR: 135 bpm / moderate variability / accels present / decels absent NST reactive / TOCO: irregular every 2-6 mins   Plan:  D/C home with labor precautions Keep scheduled appt with [facility]  Sharen Counter, CNM  10/10/2023 12:35 AM

## 2023-10-10 NOTE — Discharge Instructions (Signed)
Reasons to return to MAU at [facility]:  1.  Contractions are  5 minutes apart or less, each last 1 minute, these have been going on for 1-2 hours, and you cannot walk or talk during them 2.  You have a large gush of fluid, or a trickle of fluid that will not stop and you have to wear a pad 3.  You have bleeding that is bright red, heavier than spotting--like menstrual bleeding (spotting can be normal in early labor or after a check of your cervix) 4.  You do not feel the baby moving like he/she normally does  

## 2023-10-15 ENCOUNTER — Other Ambulatory Visit: Payer: Self-pay | Admitting: Obstetrics and Gynecology

## 2023-10-16 ENCOUNTER — Encounter (HOSPITAL_COMMUNITY): Payer: Self-pay | Admitting: Obstetrics and Gynecology

## 2023-10-16 ENCOUNTER — Inpatient Hospital Stay (HOSPITAL_COMMUNITY)
Admission: AD | Admit: 2023-10-16 | Discharge: 2023-10-16 | Disposition: A | Discharge status: Home / Self Care | Payer: BC Managed Care – PPO | Attending: Obstetrics and Gynecology | Admitting: Obstetrics and Gynecology

## 2023-10-16 DIAGNOSIS — Z3A39 39 weeks gestation of pregnancy: Secondary | ICD-10-CM | POA: Insufficient documentation

## 2023-10-16 DIAGNOSIS — Z0371 Encounter for suspected problem with amniotic cavity and membrane ruled out: Secondary | ICD-10-CM | POA: Insufficient documentation

## 2023-10-16 LAB — POCT FERN TEST: POCT Fern Test: NEGATIVE

## 2023-10-16 NOTE — MAU Note (Signed)
.  Jasmine Coffey is a 32 y.o. at [redacted]w[redacted]d here in MAU reporting: with c/o ROM at 0700 clear fluid.  Pt reports that she is having some VB as well.  +FM.   Onset of complaint: 0700 Pain score: 0  Vitals:   10/16/23 0944 10/16/23 0947  BP:  118/89  Pulse:  (!) 111  Resp:  18  Temp:  98.1 F (36.7 C)  SpO2: 100% 100%     FHT:135 Lab orders placed from triage:    Labor evaluation

## 2023-10-16 NOTE — MAU Provider Note (Signed)
Event Date/Time   First Provider Initiated Contact with Patient 10/16/23 1023       S: Ms. Jasmine Coffey is a 32 y.o. G1P0 at [redacted]w[redacted]d  who presents to MAU today complaining of leaking of fluid. Reports gush of fluid this morning that soaked through her pants. She denies vaginal bleeding. She denies contractions. She reports normal fetal movement.    O: BP 120/88 (BP Location: Right Arm)   Pulse (!) 102   Temp 98.1 F (36.7 C) (Oral)   Resp 18   Ht 5\' 9"  (1.753 m)   Wt 98 kg   SpO2 100%   BMI 31.90 kg/m  GENERAL: Well-developed, well-nourished female in no acute distress.  HEAD: Normocephalic, atraumatic.  CHEST: Normal effort of breathing, regular heart rate ABDOMEN: Soft, nontender, gravid PELVIC: Normal external female genitalia. Vagina is pink and rugated. Cervix with normal contour, no lesions. Normal discharge.  No pooling.   Cervical exam:  Dilation: 1.5 Effacement (%): 50 Cervical Position: Posterior Station: -3 Presentation: Vertex Exam by:: J.Bellamy,RN   Fetal Monitoring: Baseline: 130 Variability: moderate Accelerations: 15x15 Decelerations: no Contractions: Q 3-7 mins  Results for orders placed or performed during the hospital encounter of 10/16/23 (from the past 24 hour(s))  POCT fern test     Status: Normal   Collection Time: 10/16/23 10:37 AM  Result Value Ref Range   POCT Fern Test Negative = intact amniotic membranes    MDM SSE performed, no pooling. Negative fern slide x 2.  Informal BSUS: cephalic, AFI 16, MVP 6.  Patient expresses frustration about negative exam. Offered Rom plus; she declines. Discussed exam & findings with her OB, Dr. Billy Coast. Patient is ok for discharge home without further evaluation. She is scheduled for elective IOL tomorrow at midnight.   Reactive NST. Ctx q 3-7 minutes. Patient unaware of ctx. Cervix unchanged from office exams.   A: SIUP at [redacted]w[redacted]d  Membranes intact  P: Discharge home Labor precautions & kick  counts Return for induction  Judeth Horn, NP 10/16/2023 11:00 AM

## 2023-10-18 ENCOUNTER — Inpatient Hospital Stay (HOSPITAL_COMMUNITY)
Admission: RE | Admit: 2023-10-18 | Discharge: 2023-10-20 | DRG: 807 | Disposition: A | Discharge status: Home / Self Care | Payer: BC Managed Care – PPO | Attending: Obstetrics and Gynecology | Admitting: Obstetrics and Gynecology

## 2023-10-18 ENCOUNTER — Inpatient Hospital Stay (HOSPITAL_COMMUNITY): Payer: BC Managed Care – PPO | Admitting: Anesthesiology

## 2023-10-18 ENCOUNTER — Encounter (HOSPITAL_COMMUNITY): Payer: Self-pay | Admitting: Obstetrics and Gynecology

## 2023-10-18 ENCOUNTER — Other Ambulatory Visit: Payer: Self-pay

## 2023-10-18 DIAGNOSIS — O99824 Streptococcus B carrier state complicating childbirth: Principal | ICD-10-CM | POA: Diagnosis present

## 2023-10-18 DIAGNOSIS — Z833 Family history of diabetes mellitus: Secondary | ICD-10-CM

## 2023-10-18 DIAGNOSIS — Z3A40 40 weeks gestation of pregnancy: Secondary | ICD-10-CM

## 2023-10-18 DIAGNOSIS — O26893 Other specified pregnancy related conditions, third trimester: Secondary | ICD-10-CM | POA: Diagnosis present

## 2023-10-18 DIAGNOSIS — Z3490 Encounter for supervision of normal pregnancy, unspecified, unspecified trimester: Secondary | ICD-10-CM | POA: Diagnosis present

## 2023-10-18 LAB — CBC
HCT: 38.7 % (ref 36.0–46.0)
Hemoglobin: 13.3 g/dL (ref 12.0–15.0)
MCH: 29.8 pg (ref 26.0–34.0)
MCHC: 34.4 g/dL (ref 30.0–36.0)
MCV: 86.8 fL (ref 80.0–100.0)
Platelets: 172 10*3/uL (ref 150–400)
RBC: 4.46 MIL/uL (ref 3.87–5.11)
RDW: 13.3 % (ref 11.5–15.5)
WBC: 13.6 10*3/uL — ABNORMAL HIGH (ref 4.0–10.5)
nRBC: 0 % (ref 0.0–0.2)

## 2023-10-18 LAB — TYPE AND SCREEN
ABO/RH(D): O POS
Antibody Screen: NEGATIVE

## 2023-10-18 LAB — RPR: RPR Ser Ql: NONREACTIVE

## 2023-10-18 MED ORDER — DIPHENHYDRAMINE HCL 25 MG PO CAPS: 25.0000 mg | ORAL_CAPSULE | Freq: Four times a day (QID) | ORAL | Status: DC | PRN

## 2023-10-18 MED ORDER — FLEET ENEMA RE ENEM: 1.0000 | ENEMA | Freq: Every day | RECTAL | Status: DC | PRN

## 2023-10-18 MED ORDER — PENICILLIN G POT IN DEXTROSE 60000 UNIT/ML IV SOLN
3.0000 10*6.[IU] | INTRAVENOUS | Status: DC
  Administered 2023-10-18 (×2): 3 10*6.[IU] via INTRAVENOUS
  Filled 2023-10-18 (×2): qty 50

## 2023-10-18 MED ORDER — FENTANYL CITRATE (PF) 100 MCG/2ML IJ SOLN: 50.0000 ug | INTRAMUSCULAR | Status: DC | PRN

## 2023-10-18 MED ORDER — SODIUM CHLORIDE 0.9 % IV SOLN
5.0000 10*6.[IU] | Freq: Once | INTRAVENOUS | Status: AC
  Administered 2023-10-18: 5 10*6.[IU] via INTRAVENOUS
  Filled 2023-10-18: qty 5

## 2023-10-18 MED ORDER — LACTATED RINGERS IV SOLN
500.0000 mL | Freq: Once | INTRAVENOUS | Status: AC
  Administered 2023-10-18: 500 mL via INTRAVENOUS

## 2023-10-18 MED ORDER — TETANUS-DIPHTH-ACELL PERTUSSIS 5-2.5-18.5 LF-MCG/0.5 IM SUSY: 0.5000 mL | PREFILLED_SYRINGE | Freq: Once | INTRAMUSCULAR | Status: DC

## 2023-10-18 MED ORDER — DIBUCAINE (PERIANAL) 1 % EX OINT: 1.0000 | TOPICAL_OINTMENT | CUTANEOUS | Status: DC | PRN

## 2023-10-18 MED ORDER — TERBUTALINE SULFATE 1 MG/ML IJ SOLN: 0.2500 mg | Freq: Once | INTRAMUSCULAR | Status: DC | PRN

## 2023-10-18 MED ORDER — COCONUT OIL OIL: 1.0000 | TOPICAL_OIL | Status: DC | PRN

## 2023-10-18 MED ORDER — OXYTOCIN-SODIUM CHLORIDE 30-0.9 UT/500ML-% IV SOLN
2.5000 [IU]/h | INTRAVENOUS | Status: DC
  Filled 2023-10-18: qty 500

## 2023-10-18 MED ORDER — EPHEDRINE 5 MG/ML INJ: 10.0000 mg | INTRAVENOUS | Status: DC | PRN

## 2023-10-18 MED ORDER — ONDANSETRON HCL 4 MG PO TABS
4.0000 mg | ORAL_TABLET | ORAL | Status: DC | PRN
  Administered 2023-10-19 – 2023-10-20 (×6): 4 mg via ORAL
  Filled 2023-10-18 (×7): qty 1

## 2023-10-18 MED ORDER — LACTATED RINGERS IV SOLN: INTRAVENOUS | Status: DC

## 2023-10-18 MED ORDER — SENNOSIDES-DOCUSATE SODIUM 8.6-50 MG PO TABS
2.0000 | ORAL_TABLET | ORAL | Status: DC
  Administered 2023-10-19 – 2023-10-20 (×2): 2 via ORAL
  Filled 2023-10-18 (×2): qty 2

## 2023-10-18 MED ORDER — ACETAMINOPHEN 325 MG PO TABS: 650.0000 mg | ORAL_TABLET | ORAL | Status: DC | PRN

## 2023-10-18 MED ORDER — ONDANSETRON HCL 4 MG/2ML IJ SOLN: 4.0000 mg | INTRAMUSCULAR | Status: DC | PRN

## 2023-10-18 MED ORDER — PHENYLEPHRINE 80 MCG/ML (10ML) SYRINGE FOR IV PUSH (FOR BLOOD PRESSURE SUPPORT)
80.0000 ug | PREFILLED_SYRINGE | INTRAVENOUS | Status: DC | PRN
Start: 2023-10-18 — End: 2023-10-18

## 2023-10-18 MED ORDER — LIDOCAINE-EPINEPHRINE (PF) 2 %-1:200000 IJ SOLN
INTRAMUSCULAR | Status: DC | PRN
  Administered 2023-10-18: 3 mL via EPIDURAL

## 2023-10-18 MED ORDER — MISOPROSTOL 25 MCG QUARTER TABLET
25.0000 ug | ORAL_TABLET | Freq: Once | ORAL | Status: AC
  Administered 2023-10-18: 25 ug via ORAL
  Filled 2023-10-18: qty 1

## 2023-10-18 MED ORDER — ACETAMINOPHEN 500 MG PO TABS
1000.0000 mg | ORAL_TABLET | Freq: Four times a day (QID) | ORAL | Status: DC
  Administered 2023-10-18 – 2023-10-20 (×8): 1000 mg via ORAL
  Filled 2023-10-18 (×8): qty 2

## 2023-10-18 MED ORDER — ZOLPIDEM TARTRATE 5 MG PO TABS: 5.0000 mg | ORAL_TABLET | Freq: Every evening | ORAL | Status: DC | PRN

## 2023-10-18 MED ORDER — OXYTOCIN-SODIUM CHLORIDE 30-0.9 UT/500ML-% IV SOLN
1.0000 m[IU]/min | INTRAVENOUS | Status: DC
  Filled 2023-10-18: qty 500

## 2023-10-18 MED ORDER — BENZOCAINE-MENTHOL 20-0.5 % EX AERO
1.0000 | INHALATION_SPRAY | CUTANEOUS | Status: DC | PRN
  Administered 2023-10-19 – 2023-10-20 (×2): 1 via TOPICAL
  Filled 2023-10-18 (×2): qty 56

## 2023-10-18 MED ORDER — SIMETHICONE 80 MG PO CHEW: 80.0000 mg | CHEWABLE_TABLET | ORAL | Status: DC | PRN

## 2023-10-18 MED ORDER — BISACODYL 10 MG RE SUPP: 10.0000 mg | Freq: Every day | RECTAL | Status: DC | PRN

## 2023-10-18 MED ORDER — IBUPROFEN 600 MG PO TABS
600.0000 mg | ORAL_TABLET | Freq: Four times a day (QID) | ORAL | Status: DC
  Administered 2023-10-18 – 2023-10-20 (×8): 600 mg via ORAL
  Filled 2023-10-18 (×8): qty 1

## 2023-10-18 MED ORDER — ONDANSETRON HCL 4 MG/2ML IJ SOLN
4.0000 mg | Freq: Four times a day (QID) | INTRAMUSCULAR | Status: DC | PRN
  Administered 2023-10-18: 4 mg via INTRAVENOUS
  Filled 2023-10-18: qty 2

## 2023-10-18 MED ORDER — MISOPROSTOL 25 MCG QUARTER TABLET
25.0000 ug | ORAL_TABLET | Freq: Once | ORAL | Status: AC
  Administered 2023-10-18: 25 ug via VAGINAL
  Filled 2023-10-18: qty 1

## 2023-10-18 MED ORDER — LACTATED RINGERS IV SOLN: 500.0000 mL | INTRAVENOUS | Status: DC | PRN

## 2023-10-18 MED ORDER — DIPHENHYDRAMINE HCL 50 MG/ML IJ SOLN: 12.5000 mg | INTRAMUSCULAR | Status: DC | PRN

## 2023-10-18 MED ORDER — FENTANYL-BUPIVACAINE-NACL 0.5-0.125-0.9 MG/250ML-% EP SOLN
12.0000 mL/h | EPIDURAL | Status: DC | PRN
  Administered 2023-10-18: 12 mL/h via EPIDURAL
  Filled 2023-10-18: qty 250

## 2023-10-18 MED ORDER — WITCH HAZEL-GLYCERIN EX PADS: 1.0000 | MEDICATED_PAD | CUTANEOUS | Status: DC | PRN

## 2023-10-18 MED ORDER — OXYTOCIN BOLUS FROM INFUSION
333.0000 mL | Freq: Once | INTRAVENOUS | Status: AC
  Administered 2023-10-18: 333 mL via INTRAVENOUS

## 2023-10-18 MED ORDER — MISOPROSTOL 25 MCG QUARTER TABLET: 25.0000 ug | ORAL_TABLET | Freq: Once | ORAL | Status: DC

## 2023-10-18 MED ORDER — SOD CITRATE-CITRIC ACID 500-334 MG/5ML PO SOLN: 30.0000 mL | ORAL | Status: DC | PRN

## 2023-10-18 MED ORDER — BUPIVACAINE HCL (PF) 0.25 % IJ SOLN
INTRAMUSCULAR | Status: DC | PRN
  Administered 2023-10-18: 3 mL via EPIDURAL
  Administered 2023-10-18: 4 mL via EPIDURAL

## 2023-10-18 MED ORDER — LIDOCAINE HCL (PF) 1 % IJ SOLN: 30.0000 mL | INTRAMUSCULAR | Status: DC | PRN

## 2023-10-18 MED ORDER — PRENATAL MULTIVITAMIN CH
1.0000 | ORAL_TABLET | Freq: Every day | ORAL | Status: DC
  Administered 2023-10-19 – 2023-10-20 (×2): 1 via ORAL
  Filled 2023-10-18 (×2): qty 1

## 2023-10-18 MED ORDER — ONDANSETRON HCL 4 MG/2ML IJ SOLN
INTRAMUSCULAR | Status: AC
  Filled 2023-10-18: qty 2

## 2023-10-18 NOTE — Progress Notes (Signed)
Jasmine Coffey is a 32 y.o. G1P0 at [redacted]w[redacted]d by LMP admitted for induction of labor due to Elective at term.  Subjective: Better after epidural  Objective: BP 116/83   Pulse 66   Temp 97.9 F (36.6 C) (Oral)   Resp 18   Ht 5\' 9"  (1.753 m)   Wt 100.4 kg   BMI 32.70 kg/m  No intake/output data recorded. No intake/output data recorded.  FHT:  FHR: 140 bpm, variability: moderate,  accelerations:  Present,  decelerations:  Absent UC:   irregular, every 5 minutes SVE:   Dilation: 3 Effacement (%): 70 Station: -1 Exam by:: Treon Kehl AROM clear  Labs: Lab Results  Component Value Date   WBC 13.6 (H) 10/18/2023   HGB 13.3 10/18/2023   HCT 38.7 10/18/2023   MCV 86.8 10/18/2023   PLT 172 10/18/2023    Assessment / Plan: IOL progressing in early labor GBS positive  Labor: Progressing normally Preeclampsia:  no signs or symptoms of toxicity Fetal Wellbeing:  Category I Pain Control:  Epidural I/D:  n/a Anticipated MOD:  NSVD  Lenoard Aden, MD 10/18/2023, 8:44 AM

## 2023-10-18 NOTE — H&P (Signed)
Jasmine Coffey is a 32 y.o. female presenting for iOL at 40w. OB History     Gravida  1   Para      Term      Preterm      AB      Living         SAB      IAB      Ectopic      Multiple      Live Births             Past Medical History:  Diagnosis Date   ADHD (attention deficit hyperactivity disorder)    Asthma    Depression    Past Surgical History:  Procedure Laterality Date   ARTHROSCOPIC REPAIR ACL     KNEE SURGERY     Family History: family history includes Cancer in her maternal grandfather; Diabetes in her father. Social History:  reports that she has never smoked. She has never used smokeless tobacco. She reports current alcohol use of about 32.0 standard drinks of alcohol per week. She reports that she does not use drugs.     Maternal Diabetes: No Genetic Screening: Normal Maternal Ultrasounds/Referrals: Normal Fetal Ultrasounds or other Referrals:  None Maternal Substance Abuse:  No Significant Maternal Medications:  None Significant Maternal Lab Results:  Group B Strep positive Number of Prenatal Visits:greater than 3 verified prenatal visits Maternal Vaccinations:TDap Other Comments:  None  Review of Systems  Constitutional: Negative.   All other systems reviewed and are negative.  Maternal Medical History:  Reason for admission: Contractions.   Contractions: Onset was more than 2 days ago.   Frequency: rare.   Perceived severity is mild.   Fetal activity: Perceived fetal activity is normal.   Last perceived fetal movement was within the past hour.   Prenatal complications: no prenatal complications Prenatal Complications - Diabetes: none.   Dilation: 1.5 Effacement (%): 50 Station: -2 Exam by:: Kathi Simpers, RN Blood pressure 106/68, pulse 68, temperature 97.9 F (36.6 C), temperature source Oral, resp. rate 18, height 5\' 9"  (1.753 m), weight 100.4 kg. Maternal Exam:  Uterine Assessment: Contraction strength is mild.   Contraction frequency is rare.  Abdomen: Patient reports no abdominal tenderness. Fetal presentation: vertex Introitus: Normal vulva. Normal vagina.  Ferning test: not done.  Nitrazine test: not done. Amniotic fluid character: not assessed. Pelvis: adequate for delivery.   Cervix: Cervix evaluated by digital exam.     Physical Exam Constitutional:      Appearance: Normal appearance. She is normal weight.  HENT:     Head: Normocephalic and atraumatic.  Cardiovascular:     Rate and Rhythm: Normal rate and regular rhythm.     Pulses: Normal pulses.     Heart sounds: Normal heart sounds.  Pulmonary:     Effort: Pulmonary effort is normal.     Breath sounds: Normal breath sounds.  Abdominal:     General: Abdomen is flat.     Palpations: Abdomen is soft.  Genitourinary:    General: Normal vulva.  Musculoskeletal:        General: Normal range of motion.     Cervical back: Normal range of motion and neck supple.  Skin:    General: Skin is warm and dry.  Neurological:     General: No focal deficit present.     Mental Status: She is alert and oriented to person, place, and time.  Psychiatric:        Mood and Affect:  Mood normal.        Behavior: Behavior normal.     Prenatal labs: ABO, Rh: --/--/O POS (11/04 0030) Antibody: NEG (11/04 0030) Rubella:  imm RPR:   neg HBsAg:   neg HIV:   neg GBS:   positive  Assessment/Plan: 40w IUP IOL GBS pos IV Abx   Olajuwon Fosdick J 10/18/2023, 7:05 AM

## 2023-10-18 NOTE — Lactation Note (Signed)
This note was copied from a baby's chart. Lactation Consultation Note  Patient Name: Jasmine Coffey Date: 10/18/2023 Age:32 hours Reason for consult: Initial assessment;1st time breastfeeding;Term LC discussed hand expression using breast model and MOB easily expressed colostrum from left breast prior to latching infant. MOB latched infant on her left breast using the football hold position, using support pillows, infant latched with depth and sustained latch, was still BF after 10 minutes when LC left the room. MOB knows if infant is still cuing after latching at the 1st breast she can offer the 2nd breast during the same feeding. MOB knows to call for latch support if needed. LC discussed infant's input and output, Per MOB infant, had one void and stool since birth. LC discussed the importance of maternal rest, diet and hydration. MOB was made aware of O/P services, breastfeeding support groups, community resources, and our phone # for post-discharge questions.    Maternal Data Has patient been taught Hand Expression?: Yes Does the patient have breastfeeding experience prior to this delivery?: Yes  Feeding Mother's Current Feeding Choice: Breast Milk  LATCH Score Latch: Grasps breast easily, tongue down, lips flanged, rhythmical sucking.  Audible Swallowing: Spontaneous and intermittent  Type of Nipple: Everted at rest and after stimulation  Comfort (Breast/Nipple): Soft / non-tender  Hold (Positioning): Assistance needed to correctly position infant at breast and maintain latch.  LATCH Score: 9   Lactation Tools Discussed/Used    Interventions Interventions: Breast feeding basics reviewed;Assisted with latch;Skin to skin;Breast compression;Adjust position;Support pillows;Position options;Expressed milk;Education;LC Services brochure  Discharge Pump: DEBP;Personal  Consult Status Consult Status: Follow-up Date: 10/19/23 Follow-up type: In-patient    Frederico Hamman 10/18/2023, 6:49 PM

## 2023-10-18 NOTE — Anesthesia Procedure Notes (Signed)
Epidural Patient location during procedure: OB Start time: 10/18/2023 7:52 AM End time: 10/18/2023 8:12 AM  Staffing Anesthesiologist: Val Eagle, MD Performed: anesthesiologist   Preanesthetic Checklist Completed: patient identified, IV checked, risks and benefits discussed, monitors and equipment checked, pre-op evaluation and timeout performed  Epidural Patient position: sitting Prep: DuraPrep Patient monitoring: heart rate, continuous pulse ox and blood pressure Approach: midline Location: L4-L5 Injection technique: LOR saline  Needle:  Needle type: Tuohy  Needle gauge: 17 G Needle length: 9 cm Needle insertion depth: 7 cm Catheter type: closed end flexible Catheter size: 19 Gauge Catheter at skin depth: 14 cm Test dose: negative and 2% lidocaine with Epi 1:200 K  Assessment Events: blood not aspirated, no cerebrospinal fluid, injection not painful, no injection resistance, no paresthesia and negative IV test

## 2023-10-18 NOTE — Anesthesia Preprocedure Evaluation (Signed)
Anesthesia Evaluation  Patient identified by MRN, date of birth, ID band Patient awake    Reviewed: Allergy & Precautions, Patient's Chart, lab work & pertinent test results  History of Anesthesia Complications Negative for: history of anesthetic complications  Airway Mallampati: I  TM Distance: >3 FB Neck ROM: Full    Dental  (+) Teeth Intact   Pulmonary neg pulmonary ROS   breath sounds clear to auscultation       Cardiovascular negative cardio ROS  Rhythm:Regular     Neuro/Psych negative neurological ROS  negative psych ROS   GI/Hepatic negative GI ROS, Neg liver ROS,,,  Endo/Other  negative endocrine ROS    Renal/GU negative Renal ROS     Musculoskeletal   Abdominal   Peds  Hematology negative hematology ROS (+) Lab Results      Component                Value               Date                      WBC                      13.6 (H)            10/18/2023                HGB                      13.3                10/18/2023                HCT                      38.7                10/18/2023                MCV                      86.8                10/18/2023                PLT                      172                 10/18/2023            Denies blood thinners   Anesthesia Other Findings   Reproductive/Obstetrics (+) Pregnancy                             Anesthesia Physical Anesthesia Plan  ASA: 2  Anesthesia Plan: Epidural   Post-op Pain Management:    Induction:   PONV Risk Score and Plan: 2 and Treatment may vary due to age or medical condition  Airway Management Planned: Natural Airway  Additional Equipment: None  Intra-op Plan:   Post-operative Plan:   Informed Consent: I have reviewed the patients History and Physical, chart, labs and discussed the procedure including the risks, benefits and alternatives for the proposed anesthesia with the patient or  authorized representative who has indicated his/her understanding and acceptance.  Plan Discussed with: Anesthesiologist  Anesthesia Plan Comments:        Anesthesia Quick Evaluation

## 2023-10-19 LAB — CBC
HCT: 30.4 % — ABNORMAL LOW (ref 36.0–46.0)
Hemoglobin: 10.3 g/dL — ABNORMAL LOW (ref 12.0–15.0)
MCH: 29.6 pg (ref 26.0–34.0)
MCHC: 33.9 g/dL (ref 30.0–36.0)
MCV: 87.4 fL (ref 80.0–100.0)
Platelets: 139 10*3/uL — ABNORMAL LOW (ref 150–400)
RBC: 3.48 MIL/uL — ABNORMAL LOW (ref 3.87–5.11)
RDW: 13.8 % (ref 11.5–15.5)
WBC: 12.1 10*3/uL — ABNORMAL HIGH (ref 4.0–10.5)
nRBC: 0 % (ref 0.0–0.2)

## 2023-10-19 MED ORDER — OXYCODONE HCL 5 MG PO TABS
5.0000 mg | ORAL_TABLET | ORAL | Status: DC | PRN
  Administered 2023-10-20 (×3): 5 mg via ORAL
  Filled 2023-10-19 (×3): qty 1

## 2023-10-19 MED ORDER — OXYCODONE HCL 5 MG PO TABS
2.5000 mg | ORAL_TABLET | ORAL | Status: DC | PRN
  Administered 2023-10-19 (×3): 2.5 mg via ORAL
  Filled 2023-10-19 (×2): qty 1

## 2023-10-19 MED ORDER — OXYCODONE HCL 5 MG PO TABS
5.0000 mg | ORAL_TABLET | ORAL | Status: DC | PRN
  Filled 2023-10-19: qty 1

## 2023-10-19 NOTE — Lactation Note (Addendum)
This note was copied from a baby's chart. Lactation Consultation Note  Patient Name: Jasmine Coffey [id number] Date: 10/19/2023 Age:32 hours  Reason for consult: Follow-up assessment  P1, [redacted]w[redacted]d, 4% weight loss  Mother called and requesting LC to observe latch. Mother had baby latched in football hold. "Jasmine Coffey" eagerly latches to breast. Slight nipple pinching noted when baby detached from the breast. Assisted mother with obtaining a deep latch, breast support while baby is feeding, and breast compression for increase milk transfer. Reviewed hand expression and rolling drops of colostrum observed.  Mother is very motivated and breastfeeding well. She had positive breast changes in pregnancy. Discussed cluster feeding, calling for assistance as needed, Lactogenesis II, intake and output and engorgement prevention and management.    Feeding Mother's Current Feeding Choice: Breast Milk  LATCH Score Latch: Grasps breast easily, tongue down, lips flanged, rhythmical sucking.  Audible Swallowing: Spontaneous and intermittent  Type of Nipple: Everted at rest and after stimulation  Comfort (Breast/Nipple): Soft / non-tender  Hold (Positioning): Assistance needed to correctly position infant at breast and maintain latch.  LATCH Score: 9    Interventions Interventions: Breast feeding basics reviewed;Assisted with latch     Consult Status Consult Status: Follow-up Date: 10/20/23 Follow-up type: In-patient    Christella Hartigan M 10/19/2023, 3:32 PM

## 2023-10-19 NOTE — Lactation Note (Signed)
This note was copied from a baby's chart. Lactation Consultation Note  Patient Name: Jasmine Coffey [id number] Date: 10/19/2023 Age:32 hours Reason for consult: Follow-up assessment;1st time breastfeeding;Term  P1 40 wks, LC rounds- Mom receptive. Mom recently fed baby- discussed steps of latching. Discussed big mouth from baby and mom being comfortable moving baby deeply onto breast for nipple protection. Encouraged starting with hand expression and light breast compression use during feeding to keep baby interested at breast. Highlighted expectations of feeding/diapering progression through first days/nights. Baby with good input/output. Encouraged hand expression of extra colostrum post feed/ massaged into breast for nipple health. Encouraged mom to call when feeding, for [facility] assist as desired.  Maternal Data Does the patient have breastfeeding experience prior to this delivery?: No  Feeding Mother's Current Feeding Choice: Breast Milk   Interventions Interventions: Breast feeding basics reviewed;Hand express  Discharge    Consult Status Consult Status: Follow-up Date: 10/19/23 Follow-up type: In-patient    Idamae Lusher RN [facility] 10/19/2023, 8:47 AM

## 2023-10-19 NOTE — Progress Notes (Signed)
MOB was referred for history of depression.  * Referral screened out by Clinical Social Worker because none of the following criteria appear to apply:  ~ History of depression during this pregnancy, or of post-partum depression following prior delivery.  ~ Diagnosis of depression within last 3 years  Per OB notes, MOB did not indicate any signs/symptoms during her pregnancy. Depression dates back to 2013  OR  * MOB's symptoms currently being treated with medication and/or therapy.  Please contact the Clinical Social Worker if needs arise, by [facility] request, or if MOB scores greater than 9/yes to question 10 on [place] Postpartum Depression Screen.  Enos Fling, Theresia Majors Clinical Social Worker [phone]

## 2023-10-19 NOTE — Progress Notes (Signed)
       PPD # 1 S/P NSVD  Live born female  Birth Weight: 8 lb 2.2 oz (3690 g) APGAR: 9, 9  Newborn Delivery   Birth date/time: 10/18/2023 14:22:00 Delivery type: Vaginal, Spontaneous    Baby name: Jasmine Coffey Delivering provider: Olivia Mackie Episiotomy:None  Lacerations:2nd degree Feeding: breast  Pain control at delivery: Epidural  S:  Reports feeling well but perineum very sore.              Tolerating po/ No nausea or vomiting             Bleeding is moderate             Pain suboptimal controlled with acetaminophen and ibuprofen (OTC)             Up ad lib / ambulatory / voiding without difficulties   O:  A & O x 3, in no apparent distress              VS:  Vitals:   10/18/23 1733 10/18/23 2112 10/19/23 0122 10/19/23 0527  BP: 114/89 112/81 104/76 101/64  Pulse: (!) 128 99 87 84  Resp: 17 17 17 18   Temp: 98.5 F (36.9 C) 98.7 F (37.1 C) 98.4 F (36.9 C) 97.9 F (36.6 C)  TempSrc: Oral Oral Oral Oral  SpO2: 100% 99% 99% 100%  Weight:      Height:        LABS:  Recent Labs    10/18/23 0100 10/19/23 0438  WBC 13.6* 12.1*  HGB 13.3 10.3*  HCT 38.7 30.4*  PLT 172 139*    Blood type: --/--/O POS (11/04 0030)  Rubella: Immune (04/11 0000)   I&O: I/O last 3 completed shifts: In: 0  Out: 200 [Blood:200]          No intake/output data recorded.   Gen: AAO x 3, NAD  Abdomen: soft, non-tender, non-distended             Fundus: firm, non-tender, U-1  Perineum: repair intact, mild edema  Lochia: small  Extremities: no edema, no calf pain or tenderness    A/P: PPD # 1 32 y.o., G1P1001   Principal Problem:   Postpartum care following vaginal delivery 11/4 Active Problems:   Encounter for induction of labor   Second degree perineal laceration  - continue APAP/NSAID scheduled, add 1//-1 tab oxycodone 5 mg q 4 hrs PRN, sitz baths and benzocaine spray ad lib   SVD (spontaneous vaginal delivery)   Doing well - stable status  Routine post partum  orders  Anticipate discharge tomorrow    Neta Mends, MSN, CNM 10/19/2023, 9:11 AM

## 2023-10-19 NOTE — Anesthesia Postprocedure Evaluation (Signed)
Anesthesia Post Note  Patient: Jasmine Coffey  Procedure(s) Performed: AN AD HOC LABOR EPIDURAL     Patient location during evaluation: Mother Baby Anesthesia Type: Epidural Level of consciousness: awake, oriented and awake and alert Pain management: pain level controlled Vital Signs Assessment: post-procedure vital signs reviewed and stable Respiratory status: spontaneous breathing, respiratory function stable and nonlabored ventilation Cardiovascular status: stable Postop Assessment: no headache, adequate PO intake, able to ambulate, patient able to bend at knees and no apparent nausea or vomiting Anesthetic complications: no   No notable events documented.  Last Vitals:  Vitals:   10/19/23 0122 10/19/23 0527  BP: 104/76 101/64  Pulse: 87 84  Resp: 17 18  Temp: 36.9 C 36.6 C  SpO2: 99% 100%    Last Pain:  Vitals:   10/19/23 0809  TempSrc:   PainSc: 6    Pain Goal:                   Jashon Ishida

## 2023-10-20 MED ORDER — ACETAMINOPHEN 500 MG PO TABS: 1000.0000 mg | ORAL_TABLET | Freq: Four times a day (QID) | ORAL | 0 refills | Status: AC

## 2023-10-20 MED ORDER — IBUPROFEN 600 MG PO TABS: 600.0000 mg | ORAL_TABLET | Freq: Four times a day (QID) | ORAL | 0 refills | Status: AC

## 2023-10-20 NOTE — Progress Notes (Signed)
CSW received consult due to score 10 on Edinburgh Depression Screen. CSW met MOB at bedside to complete a mental health assessment and offer support. CSW entered the room, introduced herself and acknowledged that she had family present. MOB gave CSW verbal permission to speak about anything while her family was present. CSW explained her role and the reason for visit. MOB was polite, easy to engage, receptive to meeting with CSW, and appeared forthcoming.  CSW acknowledged Edinburgh score of 10. Patient declines a referral to [employer]. Patient verbalizes understanding that the appointment will be virtual. MOB reported stress surrounding money and bills; however FOB has recently accepted a job and she is hopeful things will improve. CSW inquired about MOB's mental health history. MOB reported experiencing anxiety during situational struggles; however denied being prescribed medication or participating in therapy for support. CSW asked MOB about needing therapy resources for this upcoming PP period; MOB said yes and therapy resources were provided. MOB reported her family as her supports. CSW provided education regarding Baby Blues vs PMADs and provided MOB with resources for mental health follow up.  CSW encouraged MOB to evaluate her mental health throughout the postpartum period with the use of the New Mom Checklist developed by Postpartum Progress as well as the [place] Postnatal Depression Scale and notify a medical professional if symptoms arise.  CSW assessed for safety with MOB SI and HI; MOB denied all. CSW did not assess for DV; FOB was present.  CSW asked MOB has she selected a pediatrician for the infant's follow up visits; MOB said [facility] [place] [facility]. MOB reported having all essential items for the infant including a carseat, bassinet and crib for safe sleeping. CSW provided review of Sudden Infant Death Syndrome (SIDS) precautions.   Enos Fling, Theresia Majors Clinical  Social Worker [phone]

## 2023-10-20 NOTE — Discharge Instructions (Signed)
Congratulations! We hope you have a wonderful postpartum period. We will plan to see you in the office in 6 weeks. Please call the office if you experience fevers (>100.4 F), heavy vaginal bleeding (using >2 pads/hour), severe pain not responsive to ibuprofen (Advil or Motrin) and acetaminophen (Tylenol), chest pain, shortness of breath, or if you have pain, redness, or swelling in one or both legs. Mood swings, fatigue, and feeling down can be normal in the first two weeks following birth. If you feel your mood symptoms are severe or lasting longer than two weeks, please call our office. If you have any thoughts of hurting yourself or others please call our office. Please call your pediatrician if you have any concerns about your baby.

## 2023-10-20 NOTE — Progress Notes (Signed)
CSW acknowledged consult and completed a clinical assessment.  There are no barriers to d/c.  Clinical assessment notes will be entered at a later time.   Enos Fling, Theresia Majors Clinical Social Worker [phone]

## 2023-10-20 NOTE — Progress Notes (Signed)
Postpartum Progress Note  PPD#2 s/p SVD  S: Patient seen and examined at bedside. Reports feeling overall well. Pain well controlled, ambulating, tolerating regular diet, voiding without issue. Denies fever, chills, chest pain, shortness of breath.  Feeding: plans to breastfeed Circ: N/A - female neonate BCM: Condoms  O:  Vitals:   10/19/23 2047 10/20/23 0544  BP: 115/78 113/81  Pulse: 84 95  Resp: 18 18  Temp: 97.7 F (36.5 C) 98.8 F (37.1 C)  SpO2: 100% 100%    PE:  GA: well appearing, NAD CV: RRR, normal S1, S2 Lungs: CTAB Abd: soft, appropriately tender, fundus firm below umbilicus Peri: moderate lochia Ext: no TTP, +1 non-pitting edema  Labs:  Lab Results  Component Value Date   WBC 12.1 (H) 10/19/2023   HGB 10.3 (L) 10/19/2023   HCT 30.4 (L) 10/19/2023   MCV 87.4 10/19/2023   PLT 139 (L) 10/19/2023   Lab Results  Component Value Date   CREATININE 0.72 02/01/2016    A/P:  32 y.o.yo G1P1001 PPD#2 s/p SVD (EBL 200 mL), doing well and progressing appropriately. Vitals within normal limits. Physical exam benign. Labs normal. Plan as follows:   #Routine OB - Regular diet, HLIV - ERAS for pain control  - Rh positive - DVT ppx: ambulating and SCDs - BCM: Condoms  #Neonate -plans to breastfeed   Plan for discharge today.   Marlene Bast, MD

## 2023-10-20 NOTE — Discharge Summary (Signed)
Postpartum Discharge Summary    Patient Name: Jasmine Coffey DOB: 1991/11/12 MRN: [id number]  Date of admission: 10/18/2023 Delivery date:10/18/2023 Delivering provider: Olivia Mackie Date of discharge: 10/20/2023  Admitting diagnosis: Encounter for induction of labor [Z34.90] Intrauterine pregnancy: [redacted]w[redacted]d     Secondary diagnosis:  Principal Problem:   Postpartum care following vaginal delivery 11/4 Active Problems:   Encounter for induction of labor   Second degree perineal laceration   SVD (spontaneous vaginal delivery)  Additional problems: None    Discharge diagnosis: Term Pregnancy Delivered                                              Post partum procedures: None Augmentation: AROM and Cytotec Complications: None  Hospital course: Induction of Labor With Vaginal Delivery   32 y.o. yo G1P1001 at [redacted]w[redacted]d was admitted to the hospital 10/18/2023 for induction of labor.  Indication for induction: Elective.  Patient had an uncomplicated labor course.  Membrane Rupture Time/Date: 8:13 AM,10/18/2023  Delivery Method:Vaginal, Spontaneous Operative Delivery:N/A Episiotomy: None Lacerations:  2nd degree Details of delivery can be found in separate delivery note.  Patient had an uncomplicated postpartum course. Patient is discharged home 10/20/23.  Newborn Data: Birth date:10/18/2023 Birth time:2:22 PM Gender:Female Living status:Living Apgars:9 ,9  Weight:3690 g  Magnesium Sulfate received: No BMZ received: No Rhophylac:N/A MMR:No T-DaP:Given prenatally Flu: No RSV Vaccine received: No Transfusion:No Immunizations administered: Immunization History  Administered Date(s) Administered   Influenza-Unspecified 08/13/2015, 10/27/2017   Tdap 02/20/2017    Physical exam  Vitals:   10/19/23 0527 10/19/23 1541 10/19/23 2047 10/20/23 0544  BP: 101/64 111/81 115/78 113/81  Pulse: 84 80 84 95  Resp: 18 15 18 18   Temp: 97.9 F (36.6 C) 98.4 F (36.9 C) 97.7 F (36.5 C)  98.8 F (37.1 C)  TempSrc: Oral Oral Oral Oral  SpO2: 100% 100% 100% 100%  Weight:      Height:       General: alert, cooperative, and no distress Lochia: appropriate Uterine Fundus: firm Incision: N/A DVT Evaluation: No evidence of DVT seen on physical exam. Labs: Lab Results  Component Value Date   WBC 12.1 (H) 10/19/2023   HGB 10.3 (L) 10/19/2023   HCT 30.4 (L) 10/19/2023   MCV 87.4 10/19/2023   PLT 139 (L) 10/19/2023      Latest Ref Rng & Units 02/01/2016    4:19 PM  CMP  Glucose 65 - 99 mg/dL 89   BUN 7 - 25 mg/dL 15   Creatinine 4.01 - 1.10 mg/dL 0.27   Sodium 253 - 664 mmol/L 136   Potassium 3.5 - 5.3 mmol/L 4.3   Chloride 98 - 110 mmol/L 105   CO2 20 - 31 mmol/L 24   Calcium 8.6 - 10.2 mg/dL 8.7   Total Protein 6.1 - 8.1 g/dL 6.6   Total Bilirubin 0.2 - 1.2 mg/dL 0.5   Alkaline Phos 33 - 115 U/L 34   AST 10 - 30 U/L 18   ALT 6 - 29 U/L 10    Edinburgh Score:    10/19/2023   10:20 AM  Edinburgh Postnatal Depression Scale Screening Tool  I have been able to laugh and see the funny side of things. 0  I have looked forward with enjoyment to things. 0  I have blamed myself unnecessarily when things went wrong. 2  I have  been anxious or worried for no good reason. 2  I have felt scared or panicky for no good reason. 2  Things have been getting on top of me. 1  I have been so unhappy that I have had difficulty sleeping. 1  I have felt sad or miserable. 1  I have been so unhappy that I have been crying. 1  The thought of harming myself has occurred to me. 0  Edinburgh Postnatal Depression Scale Total 10      After visit meds:  Allergies as of 10/20/2023       Reactions   Codeine Nausea And Vomiting   Hydrocodone Other (See Comments)        Medication List     STOP taking these medications    ondansetron 4 MG disintegrating tablet Commonly known as: Zofran ODT       TAKE these medications    acetaminophen 500 MG tablet Commonly known as:  TYLENOL Take 2 tablets (1,000 mg total) by mouth every 6 (six) hours.   Align 4 MG Caps Take 4 mg by mouth daily.   cyclobenzaprine 10 MG tablet Commonly known as: FLEXERIL Take 1 tablet (10 mg total) by mouth 2 (two) times daily as needed for muscle spasms.   hydrOXYzine 100 MG capsule Commonly known as: VISTARIL Take 1 capsule (100 mg total) by mouth 3 (three) times daily as needed for itching.   ibuprofen 600 MG tablet Commonly known as: ADVIL Take 1 tablet (600 mg total) by mouth every 6 (six) hours.   oxymetazoline 0.05 % nasal spray Commonly known as: Afrin Nasal Spray Place 1 spray into both nostrils 2 (two) times daily.         Discharge home in stable condition Infant Feeding: Breast Infant Disposition:home with mother Discharge instruction: per After Visit Summary and Postpartum booklet. Activity: Advance as tolerated. Pelvic rest for 6 weeks.  Diet: routine diet Anticipated Birth Control: Condoms Postpartum Appointment:6 weeks Additional Postpartum F/U:  N/A Future Appointments:No future appointments. Follow up Visit:  Follow-up Information     [facility], [facility]. Schedule an appointment as soon as possible for a visit in 6 week(s).   Contact information: [address] [place] [address] [phone]                     10/20/2023 Edger House, MD

## 2023-10-25 ENCOUNTER — Inpatient Hospital Stay (HOSPITAL_COMMUNITY): Payer: BC Managed Care – PPO

## 2023-11-02 ENCOUNTER — Telehealth (HOSPITAL_COMMUNITY): Payer: Self-pay

## 2023-11-02 NOTE — Telephone Encounter (Signed)
11/02/2023 1258  Name: Jasmine Coffey MRN: [id number] DOB: 02-May-1991  Reason for Call:  Transition of Care Hospital Discharge Call  Contact Status: Patient Contact Status: Message  Language assistant needed:          Follow-Up Questions:    Inocente Salles Postnatal Depression Scale:  In the Past 7 Days:    PHQ2-9 Depression Scale:     Discharge Follow-up:    Post-discharge interventions: NA  Signature  Signe Colt
